# Patient Record
Sex: Female | Born: 1980 | Race: White | Hispanic: No | Marital: Married | Smoking: Former smoker
Health system: Southern US, Community
[De-identification: ages and names within clinical notes are randomized; demographics above are authoritative.]

## PROBLEM LIST (undated history)

## (undated) DIAGNOSIS — F419 Anxiety disorder, unspecified: Secondary | ICD-10-CM

## (undated) HISTORY — PX: TONSILLECTOMY: SUR1361

## (undated) HISTORY — DX: Anxiety disorder, unspecified: F41.9

---

## 2003-11-11 ENCOUNTER — Emergency Department (HOSPITAL_COMMUNITY): Admission: EM | Admit: 2003-11-11 | Discharge: 2003-11-11 | Payer: Self-pay | Admitting: Family Medicine

## 2004-06-29 ENCOUNTER — Emergency Department: Payer: Self-pay | Admitting: General Practice

## 2004-08-30 ENCOUNTER — Ambulatory Visit (HOSPITAL_COMMUNITY): Admission: RE | Admit: 2004-08-30 | Discharge: 2004-08-30 | Payer: Self-pay | Admitting: Obstetrics & Gynecology

## 2004-12-27 ENCOUNTER — Ambulatory Visit (HOSPITAL_COMMUNITY): Admission: RE | Admit: 2004-12-27 | Discharge: 2004-12-27 | Payer: Self-pay | Admitting: Obstetrics & Gynecology

## 2005-03-13 ENCOUNTER — Inpatient Hospital Stay (HOSPITAL_COMMUNITY): Admission: RE | Admit: 2005-03-13 | Discharge: 2005-03-15 | Payer: Self-pay | Admitting: Obstetrics

## 2010-08-13 DIAGNOSIS — F411 Generalized anxiety disorder: Secondary | ICD-10-CM | POA: Insufficient documentation

## 2010-08-13 DIAGNOSIS — Z9189 Other specified personal risk factors, not elsewhere classified: Secondary | ICD-10-CM | POA: Insufficient documentation

## 2015-03-03 ENCOUNTER — Ambulatory Visit (INDEPENDENT_AMBULATORY_CARE_PROVIDER_SITE_OTHER): Payer: Self-pay | Admitting: Family Medicine

## 2015-03-03 VITALS — BP 131/90 | HR 127 | Temp 99.7°F | Resp 16 | Ht 64.0 in | Wt 145.4 lb

## 2015-03-03 DIAGNOSIS — R197 Diarrhea, unspecified: Secondary | ICD-10-CM

## 2015-03-03 DIAGNOSIS — R509 Fever, unspecified: Secondary | ICD-10-CM

## 2015-03-03 LAB — POCT URINALYSIS DIPSTICK
BILIRUBIN UA: NEGATIVE
GLUCOSE UA: NEGATIVE
Ketones, UA: NEGATIVE
Leukocytes, UA: NEGATIVE
Nitrite, UA: NEGATIVE
SPEC GRAV UA: 1.01
Urobilinogen, UA: 0.2
pH, UA: 6.5

## 2015-03-03 LAB — POCT CBC
GRANULOCYTE PERCENT: 91 % — AB (ref 37–80)
HCT, POC: 45.6 % (ref 37.7–47.9)
Hemoglobin: 15.4 g/dL (ref 12.2–16.2)
LYMPH, POC: 1 (ref 0.6–3.4)
MCH, POC: 31.1 pg (ref 27–31.2)
MCHC: 33.8 g/dL (ref 31.8–35.4)
MCV: 92 fL (ref 80–97)
MID (cbc): 0.4 (ref 0–0.9)
MPV: 7.1 fL (ref 0–99.8)
PLATELET COUNT, POC: 224 10*3/uL (ref 142–424)
POC Granulocyte: 14 — AB (ref 2–6.9)
POC LYMPH PERCENT: 6.5 %L — AB (ref 10–50)
POC MID %: 2.5 %M (ref 0–12)
RBC: 4.96 M/uL (ref 4.04–5.48)
RDW, POC: 12.4 %
WBC: 15.4 10*3/uL — AB (ref 4.6–10.2)

## 2015-03-03 LAB — POCT UA - MICROSCOPIC ONLY
CASTS, UR, LPF, POC: NEGATIVE
CRYSTALS, UR, HPF, POC: NEGATIVE
Mucus, UA: NEGATIVE
Yeast, UA: NEGATIVE

## 2015-03-03 MED ORDER — CIPROFLOXACIN HCL 500 MG PO TABS: 500.0000 mg | ORAL_TABLET | Freq: Two times a day (BID) | ORAL | Status: AC

## 2015-03-03 NOTE — Progress Notes (Signed)
[facility] [address], [place] [address] [phone]  Date:  03/03/2015   Name:  Emily Small   DOB:  11-Aug-1981   MRN:  [id number]  PCP:  No primary care provider on file.    History of Present Illness:  Emily Small is a 34 y.o. female patient who presents to [facility] for chief complaint of diarrhea, fever, chills, and fatigue for the last 2 days. Patient states that she was cleaning her pool, when she developed a headache. The diarrhea shortly ensued. It is a nonbloody diarrhea that has progressively worsened yesterday. She reports 8 bowel movements during the day followed by 8 bowel movements by night and was unable to sleep due to the disturbance. She has some abdominal cramping that diffused, however it is mild at this point. She states that she has no change in appetite and has been hungry but restricts herself to toast, bananas to help with this diarrhea. She has never had diarrhea like this in her life. She has had fever of 101-102. Last administration of Tylenol was at 4:30 AM.  She has mild nausea but no vomiting. There is some generalized body aches. She does not have dizziness associated with her symptoms, however her anxiety includes dizziness and has been anxious since the onset of her symptoms.  She has no urinary symptoms of blood, dysuria, or frequency. There are no vision changes. She has no chest pains or palpitations or shortness of breath.  She has 3 children who have not reported any similar symptoms. One is in a daycare.  She denies any abnormal dietary intake at this time. She has been hydrating well with water, however will drink up to 12 beers per day. She has spent weeks outside working on this pool in the heat.  She feels that her diarrhea has improved over today, though she continues to have diarrhea.     To note, patient states that she is incredibly anxious given her symptoms and that she is at the doctor's office.    There are no active  problems to display for this patient.   Past Medical History  Diagnosis Date  . Anxiety     History reviewed. No pertinent past surgical history.  History  Substance Use Topics  . Smoking status: Never Smoker   . Smokeless tobacco: Never Used  . Alcohol Use: 7.2 oz/week    0 Standard drinks or equivalent, 12 Cans of beer per week    Family History  Problem Relation Age of Onset  . Heart disease Mother   . Hyperlipidemia Mother     No Known Allergies  Medication list has been reviewed and updated.  No current outpatient prescriptions on file prior to visit.   No current facility-administered medications on file prior to visit.    ROS  ROS otherwise unremarkable unless listed above.   Physical Examination: BP 140/80 mmHg  Pulse 130  Temp(Src) 99.7 F (37.6 C) (Oral)  Resp 16  Ht 5\' 4"  (1.626 m)  Wt 145 lb 6.4 oz (65.953 kg)  BMI 24.95 kg/m2  SpO2 99%  LMP  (Approximate) Ideal Body Weight: Weight in (lb) to have BMI = 25: 145.3  Physical Exam alert, cooperative, oriented 4. PERRLA with normal conjunctiva. There is no lymphadenopathy detected. There is no thyromegaly or mass. Breath sounds are normal without wheezing or rhonchi. Heart rate was regular with regular rhythm. No murmurs, gallops, or rubs. Bowel sounds are hyperactive. Belly is  soft there is only mild tenderness along the periumbilical quadrant.  No hepatosplenomegaly.  No CVA tenderness.  Pulses normal in extremities.    CBC    Component Value Date/Time   WBC 15.4* 03/03/2015 1342   RBC 4.96 03/03/2015 1342   HGB 15.4 03/03/2015 1342   HCT 45.6 03/03/2015 1342   MCV 92.0 03/03/2015 1342   MCH 31.1 03/03/2015 1342   MCHC 33.8 03/03/2015 1342     Results for orders placed or performed in visit on 03/03/15  POCT CBC  Result Value Ref Range   WBC 15.4 (A) 4.6 - 10.2 K/uL   Lymph, poc 1.0 0.6 - 3.4   POC LYMPH PERCENT 6.5 (A) 10 - 50 %L   MID (cbc) 0.4 0 - 0.9   POC MID % 2.5 0 - 12 %M    POC Granulocyte 14.0 (A) 2 - 6.9   Granulocyte percent 91.0 (A) 37 - 80 %G   RBC 4.96 4.04 - 5.48 M/uL   Hemoglobin 15.4 12.2 - 16.2 g/dL   HCT, POC 16.1 09.6 - 47.9 %   MCV 92.0 80 - 97 fL   MCH, POC 31.1 27 - 31.2 pg   MCHC 33.8 31.8 - 35.4 g/dL   RDW, POC 04.5 %   Platelet Count, POC 224 142 - 424 K/uL   MPV 7.1 0 - 99.8 fL  POCT UA - Microscopic Only  Result Value Ref Range   WBC, Ur, HPF, POC 0-2    RBC, urine, microscopic 0-2    Bacteria, U Microscopic trace    Mucus, UA negative    Epithelial cells, urine per micros 0-3    Crystals, Ur, HPF, POC negative    Casts, Ur, LPF, POC negtaive    Yeast, UA negative   POCT urinalysis dipstick  Result Value Ref Range   Color, UA yellow    Clarity, UA clear    Glucose, UA negative    Bilirubin, UA negative    Ketones, UA negative    Spec Grav, UA 1.010    Blood, UA small    pH, UA 6.5    Protein, UA trace    Urobilinogen, UA 0.2    Nitrite, UA negative    Leukocytes, UA Negative Negative   Orthostatic VS for the past 24 hrs:  BP- Lying Pulse- Lying BP- Sitting Pulse- Sitting BP- Standing at 0 minutes Pulse- Standing at 0 minutes  03/03/15 1356 131/90 mmHg 90 129/85 mmHg 61 (!) 137/92 mmHg 139  (BP cuff stopped working for a second, causing the patient to become significantly nervous)   Assessment and Plan:  34 year old female with past medical history of anxiety is here today for chief complaint of diarrhea, headache, fatigue. and body aches.   likely gastroenteritis. Set. We will treat her for bacterial as there is a left shift here. This could also be a combination of hyperthermia as well as gastroenteritis.  Advised not to use anti-motility at this time.  We will recheck her in 4 days.  Advised of alarming sxs to warrant a more immediate return.  If sxs do not resolve, will add cmet and lipase, but acuity is likely due to gi infection.   Orthostats with error secondary to anxiety.     Diarrhea - Plan: POCT CBC, POCT UA -  Microscopic Only, POCT urinalysis dipstick, ciprofloxacin (CIPRO) 500 MG tablet  Fever, unspecified fever cause - Plan: POCT CBC, POCT UA - Microscopic Only, POCT urinalysis dipstick, ciprofloxacin (CIPRO) 500 MG  tablet    Trena Platt, PA-C [facility] 03/03/2015 12:46 PM

## 2015-03-03 NOTE — Patient Instructions (Signed)
We are going to put you on cipro at this time, in chance of a bacterial etiology for these symptoms.   I would like you to return to [place] on Monday.  I will be here at 12 noon.  There we can recheck you.  Food Choices to Help Relieve Diarrhea When you have diarrhea, the foods you eat and your eating habits are very important. Choosing the right foods and drinks can help relieve diarrhea. Also, because diarrhea can last up to 7 days, you need to replace lost fluids and electrolytes (such as sodium, potassium, and chloride) in order to help prevent dehydration.  WHAT GENERAL GUIDELINES DO I NEED TO FOLLOW?  Slowly drink 1 cup (8 oz) of fluid for each episode of diarrhea. If you are getting enough fluid, your urine will be clear or pale yellow.  Eat starchy foods. Some good choices include white rice, white toast, pasta, low-fiber cereal, baked potatoes (without the skin), saltine crackers, and bagels.  Avoid large servings of any cooked vegetables.  Limit fruit to two servings per day. A serving is  cup or 1 small piece.  Choose foods with less than 2 g of fiber per serving.  Limit fats to less than 8 tsp (38 g) per day.  Avoid fried foods.  Eat foods that have probiotics in them. Probiotics can be found in certain dairy products.  Avoid foods and beverages that may increase the speed at which food moves through the stomach and intestines (gastrointestinal tract). Things to avoid include:  High-fiber foods, such as dried fruit, raw fruits and vegetables, nuts, seeds, and whole grain foods.  Spicy foods and high-fat foods.  Foods and beverages sweetened with high-fructose corn syrup, honey, or sugar alcohols such as xylitol, sorbitol, and mannitol. WHAT FOODS ARE RECOMMENDED? Grains White rice. White, [place], or pita breads (fresh or toasted), including plain rolls, buns, or bagels. White pasta. Saltine, soda, or graham crackers. Pretzels. Low-fiber cereal. Cooked cereals made with water  (such as cornmeal, farina, or cream cereals). Plain muffins. Matzo. Melba toast. Zwieback.  Vegetables Potatoes (without the skin). Strained tomato and vegetable juices. Most well-cooked and canned vegetables without seeds. Tender lettuce. Fruits Cooked or canned applesauce, apricots, cherries, fruit cocktail, grapefruit, peaches, pears, or plums. Fresh bananas, apples without skin, cherries, grapes, cantaloupe, grapefruit, peaches, oranges, or plums.  Meat and Other Protein Products Baked or boiled chicken. Eggs. Tofu. Fish. Seafood. Smooth peanut butter. Ground or well-cooked tender beef, ham, veal, lamb, pork, or poultry.  Dairy Plain yogurt, kefir, and unsweetened liquid yogurt. Lactose-free milk, buttermilk, or soy milk. Plain hard cheese. Beverages Sport drinks. Clear broths. Diluted fruit juices (except prune). Regular, caffeine-free sodas such as ginger ale. Water. Decaffeinated teas. Oral rehydration solutions. Sugar-free beverages not sweetened with sugar alcohols. Other Bouillon, broth, or soups made from recommended foods.  The items listed above may not be a complete list of recommended foods or beverages. Contact your dietitian for more options. WHAT FOODS ARE NOT RECOMMENDED? Grains Whole grain, whole wheat, bran, or rye breads, rolls, pastas, crackers, and cereals. Wild or brown rice. Cereals that contain more than 2 g of fiber per serving. Corn tortillas or taco shells. Cooked or dry oatmeal. Granola. Popcorn. Vegetables Raw vegetables. Cabbage, broccoli, Brussels sprouts, artichokes, baked beans, beet greens, corn, kale, legumes, peas, sweet potatoes, and yams. Potato skins. Cooked spinach and cabbage. Fruits Dried fruit, including raisins and dates. Raw fruits. Stewed or dried prunes. Fresh apples with skin, apricots, mangoes, pears, raspberries,  and strawberries.  Meat and Other Protein Products Chunky peanut butter. Nuts and seeds. Beans and lentils. Tomasa Blase.   Dairy High-fat cheeses. Milk, chocolate milk, and beverages made with milk, such as milk shakes. Cream. Ice cream. Sweets and Desserts Sweet rolls, doughnuts, and sweet breads. Pancakes and waffles. Fats and Oils Butter. Cream sauces. Margarine. Salad oils. Plain salad dressings. Olives. Avocados.  Beverages Caffeinated beverages (such as coffee, tea, soda, or energy drinks). Alcoholic beverages. Fruit juices with pulp. Prune juice. Soft drinks sweetened with high-fructose corn syrup or sugar alcohols. Other Coconut. Hot sauce. Chili powder. Mayonnaise. Gravy. Cream-based or milk-based soups.  The items listed above may not be a complete list of foods and beverages to avoid. Contact your dietitian for more information. WHAT SHOULD I DO IF I BECOME DEHYDRATED? Diarrhea can sometimes lead to dehydration. Signs of dehydration include dark urine and dry mouth and skin. If you think you are dehydrated, you should rehydrate with an oral rehydration solution. These solutions can be purchased at pharmacies, retail stores, or online.  Drink -1 cup (120-240 mL) of oral rehydration solution each time you have an episode of diarrhea. If drinking this amount makes your diarrhea worse, try drinking smaller amounts more often. For example, drink 1-3 tsp (5-15 mL) every 5-10 minutes.  A general rule for staying hydrated is to drink 1-2 L of fluid per day. Talk to your health care provider about the specific amount you should be drinking each day. Drink enough fluids to keep your urine clear or pale yellow. Document Released: 10/20/2003 Document Revised: 08/04/2013 Document Reviewed: 06/22/2013 [facility] Patient Information 2015 [place], [place]. This information is not intended to replace advice given to you by your health care provider. Make sure you discuss any questions you have with your health care provider.

## 2015-03-07 ENCOUNTER — Telehealth: Payer: Self-pay

## 2015-03-07 NOTE — Telephone Encounter (Signed)
Pt states she needs to talk to her provider about how she is doing,and also questions re meds   Best phone for pt is [phone]

## 2015-03-08 NOTE — Progress Notes (Signed)
Patient ID: Emily Small, female   DOB: 02-17-1981, 34 y.o.   MRN: [id number] Reviewed documentation and agree w/ assessment and plan. Norberto Sorenson, MD MPH

## 2015-03-08 NOTE — Telephone Encounter (Signed)
Pt called and says she is feeling 100% better.  Pt said she read online that she cannot take Celexa and Cipro together. She read it can cause rapid heartbeat. She is afraid it will mess with her anxiety. She wants a different abx that she can take with Celexa and not have any worries.

## 2015-05-12 ENCOUNTER — Other Ambulatory Visit: Payer: Self-pay

## 2015-05-16 LAB — CYTOLOGY - PAP

## 2015-08-14 NOTE — L&D Delivery Note (Signed)
Delivery Note At 1:19 PM a healthy female was delivered via Vaginal, Spontaneous Delivery (Presentation: OA  ).  APGAR:8 ,9; weight  pending.  Moderate meconium stained fluid. Placenta status: delivered spontaneously.  Cord:  with the following complications: none.   Anesthesia: Epidural  Episiotomy:  None Lacerations: first degree repaired for hemostasis  Suture Repair: 3.0 vicryl rapide Est. Blood Loss (mL):  175ml  Mom to postpartum.  Baby to Couplet care / Skin to Skin. D/Small pt circumcision and they plan to proceed at pediatrician's office.  Emily Small,Emily Small 03/21/2016, 1:46 PM

## 2015-08-24 DIAGNOSIS — O09529 Supervision of elderly multigravida, unspecified trimester: Secondary | ICD-10-CM | POA: Insufficient documentation

## 2015-08-24 LAB — OB RESULTS CONSOLE RPR: RPR: NONREACTIVE

## 2015-08-24 LAB — OB RESULTS CONSOLE GC/CHLAMYDIA
Chlamydia: NEGATIVE
Gonorrhea: NEGATIVE

## 2015-08-24 LAB — OB RESULTS CONSOLE ABO/RH: RH TYPE: POSITIVE

## 2015-08-24 LAB — OB RESULTS CONSOLE ANTIBODY SCREEN: Antibody Screen: NEGATIVE

## 2015-08-24 LAB — OB RESULTS CONSOLE RUBELLA ANTIBODY, IGM: Rubella: IMMUNE

## 2015-08-24 LAB — OB RESULTS CONSOLE HIV ANTIBODY (ROUTINE TESTING): HIV: NONREACTIVE

## 2015-08-24 LAB — OB RESULTS CONSOLE HEPATITIS B SURFACE ANTIGEN: HEP B S AG: NEGATIVE

## 2015-09-21 DIAGNOSIS — O09529 Supervision of elderly multigravida, unspecified trimester: Secondary | ICD-10-CM | POA: Insufficient documentation

## 2016-01-16 ENCOUNTER — Encounter: Payer: Medicaid Other | Attending: Obstetrics and Gynecology | Admitting: Skilled Nursing Facility1

## 2016-01-16 VITALS — Ht 62.0 in | Wt 171.0 lb

## 2016-01-16 DIAGNOSIS — O24419 Gestational diabetes mellitus in pregnancy, unspecified control: Secondary | ICD-10-CM | POA: Diagnosis present

## 2016-01-16 DIAGNOSIS — O24410 Gestational diabetes mellitus in pregnancy, diet controlled: Secondary | ICD-10-CM

## 2016-01-17 ENCOUNTER — Encounter: Payer: Self-pay | Admitting: Skilled Nursing Facility1

## 2016-01-17 NOTE — Progress Notes (Signed)
  Patient was seen on 01/16/2016 for Gestational Diabetes self-management class at [facility]. The following learning objectives were met by the patient during this course:   States the definition of Gestational Diabetes  States why dietary management is important in controlling blood glucose  Describes the effects each nutrient has on blood glucose levels  Demonstrates ability to create a balanced meal plan  Demonstrates carbohydrate counting   States when to check blood glucose levels involving a total of 4 separate occurences in a day  Demonstrates proper blood glucose monitoring techniques  States the effect of stress and exercise on blood glucose levels  States the importance of limiting caffeine and abstaining from alcohol and smoking  Demonstrates the knowledge the glucometer provided in class may not be covered by their insurance and to call their insurance provider immediately after class to know which glucometer their insurance provider does cover as well as calling their physician the next day for a prescription to the glucometer their insurance does cover (if the one provided is not) as well as the lancets and strips for that meter.  Blood glucose monitor given: Contour next one Lot # [id number] Exp: 10/10/2016 Blood glucose reading: Not taken due to unavailability of strips  Patient instructed to monitor glucose levels: FBS: 60 - <90 1 hour: <140 2 hour: <120  *Patient received handouts:  Nutrition Diabetes and Pregnancy  Carbohydrate Counting List  Patient will be seen for follow-up as needed.

## 2016-02-01 ENCOUNTER — Encounter: Payer: Self-pay | Admitting: Obstetrics and Gynecology

## 2016-02-22 LAB — OB RESULTS CONSOLE GBS: GBS: NEGATIVE

## 2016-03-20 ENCOUNTER — Encounter (HOSPITAL_COMMUNITY): Payer: Self-pay | Admitting: *Deleted

## 2016-03-20 ENCOUNTER — Inpatient Hospital Stay (HOSPITAL_COMMUNITY)
Admission: AD | Admit: 2016-03-20 | Discharge: 2016-03-20 | Disposition: A | Discharge status: Home / Self Care | Payer: Medicaid Other | Source: Ambulatory Visit | Attending: Obstetrics and Gynecology | Admitting: Obstetrics and Gynecology

## 2016-03-20 NOTE — Progress Notes (Signed)
Notified of pt arrival in MAU and exam after 1 hour of walking. Will discharge home with labor precautions

## 2016-03-20 NOTE — MAU Note (Signed)
PT  SAYS [facility]  STRONG  SINCE 3 PM--   [facility]  AT    [facility]-  LAST   Thursday    VE   1  CM  .       DENIES HSV AND  RMSA.  GBS-  NEG

## 2016-03-20 NOTE — Discharge Instructions (Signed)
Third Trimester of Pregnancy °The third trimester is from week 29 through week 42, months 7 through 9. The third trimester is a time when the fetus is growing rapidly. At the end of the ninth month, the fetus is about 20 inches in length and weighs 6-10 pounds.  °BODY CHANGES °Your body goes through many changes during pregnancy. The changes vary from woman to woman.  °· Your weight will continue to increase. You can expect to gain 25-35 pounds (11-16 kg) by the end of the pregnancy. °· You may begin to get stretch marks on your hips, abdomen, and breasts. °· You may urinate more often because the fetus is moving lower into your pelvis and pressing on your bladder. °· You may develop or continue to have heartburn as a result of your pregnancy. °· You may develop constipation because certain hormones are causing the muscles that push waste through your intestines to slow down. °· You may develop hemorrhoids or swollen, bulging veins (varicose veins). °· You may have pelvic pain because of the weight gain and pregnancy hormones relaxing your joints between the bones in your pelvis. Backaches may result from overexertion of the muscles supporting your posture. °· You may have changes in your hair. These can include thickening of your hair, rapid growth, and changes in texture. Some women also have hair loss during or after pregnancy, or hair that feels dry or thin. Your hair will most likely return to normal after your baby is born. °· Your breasts will continue to grow and be tender. A yellow discharge may leak from your breasts called colostrum. °· Your belly button may stick out. °· You may feel short of breath because of your expanding uterus. °· You may notice the fetus "dropping," or moving lower in your abdomen. °· You may have a bloody mucus discharge. This usually occurs a few days to a week before labor begins. °· Your cervix becomes thin and soft (effaced) near your due date. °WHAT TO EXPECT AT YOUR PRENATAL  EXAMS  °You will have prenatal exams every 2 weeks until week 36. Then, you will have weekly prenatal exams. During a routine prenatal visit: °· You will be weighed to make sure you and the fetus are growing normally. °· Your blood pressure is taken. °· Your abdomen will be measured to track your baby's growth. °· The fetal heartbeat will be listened to. °· Any test results from the previous visit will be discussed. °· You may have a cervical check near your due date to see if you have effaced. °At around 36 weeks, your caregiver will check your cervix. At the same time, your caregiver will also perform a test on the secretions of the vaginal tissue. This test is to determine if a type of bacteria, Group B streptococcus, is present. Your caregiver will explain this further. °Your caregiver may ask you: °· What your birth plan is. °· How you are feeling. °· If you are feeling the baby move. °· If you have had any abnormal symptoms, such as leaking fluid, bleeding, severe headaches, or abdominal cramping. °· If you are using any tobacco products, including cigarettes, chewing tobacco, and electronic cigarettes. °· If you have any questions. °Other tests or screenings that may be performed during your third trimester include: °· Blood tests that check for low iron levels (anemia). °· Fetal testing to check the health, activity level, and growth of the fetus. Testing is done if you have certain medical conditions or if   there are problems during the pregnancy. °· HIV (human immunodeficiency virus) testing. If you are at high risk, you may be screened for HIV during your third trimester of pregnancy. °FALSE LABOR °You may feel small, irregular contractions that eventually go away. These are called Braxton Hicks contractions, or false labor. Contractions may last for hours, days, or even weeks before true labor sets in. If contractions come at regular intervals, intensify, or become painful, it is best to be seen by your  caregiver.  °SIGNS OF LABOR  °· Menstrual-like cramps. °· Contractions that are 5 minutes apart or less. °· Contractions that start on the top of the uterus and spread down to the lower abdomen and back. °· A sense of increased pelvic pressure or back pain. °· A watery or bloody mucus discharge that comes from the vagina. °If you have any of these signs before the 37th week of pregnancy, call your caregiver right away. You need to go to the hospital to get checked immediately. °HOME CARE INSTRUCTIONS  °· Avoid all smoking, herbs, alcohol, and unprescribed drugs. These chemicals affect the formation and growth of the baby. °· Do not use any tobacco products, including cigarettes, chewing tobacco, and electronic cigarettes. If you need help quitting, ask your health care provider. You may receive counseling support and other resources to help you quit. °· Follow your caregiver's instructions regarding medicine use. There are medicines that are either safe or unsafe to take during pregnancy. °· Exercise only as directed by your caregiver. Experiencing uterine cramps is a good sign to stop exercising. °· Continue to eat regular, healthy meals. °· Wear a good support bra for breast tenderness. °· Do not use hot tubs, steam rooms, or saunas. °· Wear your seat belt at all times when driving. °· Avoid raw meat, uncooked cheese, cat litter boxes, and soil used by cats. These carry germs that can cause birth defects in the baby. °· Take your prenatal vitamins. °· Take 1500-2000 mg of calcium daily starting at the 20th week of pregnancy until you deliver your baby. °· Try taking a stool softener (if your caregiver approves) if you develop constipation. Eat more high-fiber foods, such as fresh vegetables or fruit and whole grains. Drink plenty of fluids to keep your urine clear or pale yellow. °· Take warm sitz baths to soothe any pain or discomfort caused by hemorrhoids. Use hemorrhoid cream if your caregiver approves. °· If  you develop varicose veins, wear support hose. Elevate your feet for 15 minutes, 3-4 times a day. Limit salt in your diet. °· Avoid heavy lifting, wear low heal shoes, and practice good posture. °· Rest a lot with your legs elevated if you have leg cramps or low back pain. °· Visit your dentist if you have not gone during your pregnancy. Use a soft toothbrush to brush your teeth and be gentle when you floss. °· A sexual relationship may be continued unless your caregiver directs you otherwise. °· Do not travel far distances unless it is absolutely necessary and only with the approval of your caregiver. °· Take prenatal classes to understand, practice, and ask questions about the labor and delivery. °· Make a trial run to the hospital. °· Pack your hospital bag. °· Prepare the baby's nursery. °· Continue to go to all your prenatal visits as directed by your caregiver. °SEEK MEDICAL CARE IF: °· You are unsure if you are in labor or if your water has broken. °· You have dizziness. °· You have   mild pelvic cramps, pelvic pressure, or nagging pain in your abdominal area. °· You have persistent nausea, vomiting, or diarrhea. °· You have a bad smelling vaginal discharge. °· You have pain with urination. °SEEK IMMEDIATE MEDICAL CARE IF:  °· You have a fever. °· You are leaking fluid from your vagina. °· You have spotting or bleeding from your vagina. °· You have severe abdominal cramping or pain. °· You have rapid weight loss or gain. °· You have shortness of breath with chest pain. °· You notice sudden or extreme swelling of your face, hands, ankles, feet, or legs. °· You have not felt your baby move in over an hour. °· You have severe headaches that do not go away with medicine. °· You have vision changes. °  °This information is not intended to replace advice given to you by your health care provider. Make sure you discuss any questions you have with your health care provider. °  °Document Released: 07/24/2001 Document  Revised: 08/20/2014 Document Reviewed: 09/30/2012 °[employer]vier Interactive Patient Education ©2016 [employer] °Fetal Movement Counts °Patient Name: __________________________________________________ Patient Due Date: ____________________ °Performing a fetal movement count is highly recommended in high-risk pregnancies, but it is good for every pregnant woman to do. Your health care provider may ask you to start counting fetal movements at 28 weeks of the pregnancy. Fetal movements often increase: °· After eating a full meal. °· After physical activity. °· After eating or drinking something sweet or cold. °· At rest. °Pay attention to when you feel the baby is most active. This will help you notice a pattern of your baby's sleep and wake cycles and what factors contribute to an increase in fetal movement. It is important to perform a fetal movement count at the same time each day when your baby is normally most active.  °HOW TO COUNT FETAL MOVEMENTS °1. Find a quiet and comfortable area to sit or lie down on your left side. Lying on your left side provides the best blood and oxygen circulation to your baby. °2. Write down the day and time on a sheet of paper or in a journal. °3. Start counting kicks, flutters, swishes, rolls, or jabs in a 2-hour period. You should feel at least 10 movements within 2 hours. °4. If you do not feel 10 movements in 2 hours, wait 2-3 hours and count again. Look for a change in the pattern or not enough counts in 2 hours. °SEEK MEDICAL CARE IF: °· You feel less than 10 counts in 2 hours, tried twice. °· There is no movement in over an hour. °· The pattern is changing or taking longer each day to reach 10 counts in 2 hours. °· You feel the baby is not moving as he or she usually does. °Date: ____________ Movements: ____________ Start time: ____________ Finish time: ____________  °Date: ____________ Movements: ____________ Start time: ____________ Finish time: ____________ °Date:  ____________ Movements: ____________ Start time: ____________ Finish time: ____________ °Date: ____________ Movements: ____________ Start time: ____________ Finish time: ____________ °Date: ____________ Movements: ____________ Start time: ____________ Finish time: ____________ °Date: ____________ Movements: ____________ Start time: ____________ Finish time: ____________ °Date: ____________ Movements: ____________ Start time: ____________ Finish time: ____________ °Date: ____________ Movements: ____________ Start time: ____________ Finish time: ____________  °Date: ____________ Movements: ____________ Start time: ____________ Finish time: ____________ °Date: ____________ Movements: ____________ Start time: ____________ Finish time: ____________ °Date: ____________ Movements: ____________ Start time: ____________ Finish time: ____________ °Date: ____________ Movements: ____________ Start time: ____________ Finish time: ____________ °Date:   ____________ Movements: ____________ Start time: ____________ Finish time: ____________ °Date: ____________ Movements: ____________ Start time: ____________ Finish time: ____________ °Date: ____________ Movements: ____________ Start time: ____________ Finish time: ____________  °Date: ____________ Movements: ____________ Start time: ____________ Finish time: ____________ °Date: ____________ Movements: ____________ Start time: ____________ Finish time: ____________ °Date: ____________ Movements: ____________ Start time: ____________ Finish time: ____________ °Date: ____________ Movements: ____________ Start time: ____________ Finish time: ____________ °Date: ____________ Movements: ____________ Start time: ____________ Finish time: ____________ °Date: ____________ Movements: ____________ Start time: ____________ Finish time: ____________ °Date: ____________ Movements: ____________ Start time: ____________ Finish time: ____________  °Date: ____________ Movements: ____________ Start  time: ____________ Finish time: ____________ °Date: ____________ Movements: ____________ Start time: ____________ Finish time: ____________ °Date: ____________ Movements: ____________ Start time: ____________ Finish time: ____________ °Date: ____________ Movements: ____________ Start time: ____________ Finish time: ____________ °Date: ____________ Movements: ____________ Start time: ____________ Finish time: ____________ °Date: ____________ Movements: ____________ Start time: ____________ Finish time: ____________ °Date: ____________ Movements: ____________ Start time: ____________ Finish time: ____________  °Date: ____________ Movements: ____________ Start time: ____________ Finish time: ____________ °Date: ____________ Movements: ____________ Start time: ____________ Finish time: ____________ °Date: ____________ Movements: ____________ Start time: ____________ Finish time: ____________ °Date: ____________ Movements: ____________ Start time: ____________ Finish time: ____________ °Date: ____________ Movements: ____________ Start time: ____________ Finish time: ____________ °Date: ____________ Movements: ____________ Start time: ____________ Finish time: ____________ °Date: ____________ Movements: ____________ Start time: ____________ Finish time: ____________  °Date: ____________ Movements: ____________ Start time: ____________ Finish time: ____________ °Date: ____________ Movements: ____________ Start time: ____________ Finish time: ____________ °Date: ____________ Movements: ____________ Start time: ____________ Finish time: ____________ °Date: ____________ Movements: ____________ Start time: ____________ Finish time: ____________ °Date: ____________ Movements: ____________ Start time: ____________ Finish time: ____________ °Date: ____________ Movements: ____________ Start time: ____________ Finish time: ____________ °Date: ____________ Movements: ____________ Start time: ____________ Finish time: ____________    °Date: ____________ Movements: ____________ Start time: ____________ Finish time: ____________ °Date: ____________ Movements: ____________ Start time: ____________ Finish time: ____________ °Date: ____________ Movements: ____________ Start time: ____________ Finish time: ____________ °Date: ____________ Movements: ____________ Start time: ____________ Finish time: ____________ °Date: ____________ Movements: ____________ Start time: ____________ Finish time: ____________ °Date: ____________ Movements: ____________ Start time: ____________ Finish time: ____________ °Date: ____________ Movements: ____________ Start time: ____________ Finish time: ____________  °Date: ____________ Movements: ____________ Start time: ____________ Finish time: ____________ °Date: ____________ Movements: ____________ Start time: ____________ Finish time: ____________ °Date: ____________ Movements: ____________ Start time: ____________ Finish time: ____________ °Date: ____________ Movements: ____________ Start time: ____________ Finish time: ____________ °Date: ____________ Movements: ____________ Start time: ____________ Finish time: ____________ °Date: ____________ Movements: ____________ Start time: ____________ Finish time: ____________ °  °This information is not intended to replace advice given to you by your health care provider. Make sure you discuss any questions you have with your health care provider. °  °Document Released: 08/29/2006 Document Revised: 08/20/2014 Document Reviewed: 05/26/2012 °[employer] Interactive Patient Education ©2016 [employer] °Braxton Hicks Contractions °Contractions of the uterus can occur throughout pregnancy. Contractions are not always a sign that you are in labor.  °WHAT ARE BRAXTON HICKS CONTRACTIONS?  °Contractions that occur before labor are called Braxton Hicks contractions, or false labor. Toward the end of pregnancy (32-34 weeks), these contractions can develop more often and may become more  forceful. This is not true labor because these contractions do not result in opening (dilatation) and thinning of the cervix. They are sometimes difficult to tell apart from true labor because these contractions can be forceful and people have different pain tolerances. You   should not feel embarrassed if you go to the hospital with false labor. Sometimes, the only way to tell if you are in true labor is for your health care provider to look for changes in the cervix. °If there are no prenatal problems or other health problems associated with the pregnancy, it is completely safe to be sent home with false labor and await the onset of true labor. °HOW CAN YOU TELL THE DIFFERENCE BETWEEN TRUE AND FALSE LABOR? °False Labor °· The contractions of false labor are usually shorter and not as hard as those of true labor.   °· The contractions are usually irregular.   °· The contractions are often felt in the front of the lower abdomen and in the groin.   °· The contractions may go away when you walk around or change positions while lying down.   °· The contractions get weaker and are shorter lasting as time goes on.   °· The contractions do not usually become progressively stronger, regular, and closer together as with true labor.   °True Labor °· Contractions in true labor last 30-70 seconds, become very regular, usually become more intense, and increase in frequency.   °· The contractions do not go away with walking.   °· The discomfort is usually felt in the top of the uterus and spreads to the lower abdomen and low back.   °· True labor can be determined by your health care provider with an exam. This will show that the cervix is dilating and getting thinner.   °WHAT TO REMEMBER °· Keep up with your usual exercises and follow other instructions given by your health care provider.   °· Take medicines as directed by your health care provider.   °· Keep your regular prenatal appointments.   °· Eat and drink lightly if you  think you are going into labor.   °· If Braxton Hicks contractions are making you uncomfortable:   °¨ Change your position from lying down or resting to walking, or from walking to resting.   °¨ Sit and rest in a tub of warm water.   °¨ Drink 2-3 glasses of water. Dehydration may cause these contractions.   °¨ Do slow and deep breathing several times an hour.   °WHEN SHOULD I SEEK IMMEDIATE MEDICAL CARE? °Seek immediate medical care if: °· Your contractions become stronger, more regular, and closer together.   °· You have fluid leaking or gushing from your vagina.   °· You have a fever.   °· You pass blood-tinged mucus.   °· You have vaginal bleeding.   °· You have continuous abdominal pain.   °· You have low back pain that you never had before.   °· You feel your baby's head pushing down and causing pelvic pressure.   °· Your baby is not moving as much as it used to.   °  °This information is not intended to replace advice given to you by your health care provider. Make sure you discuss any questions you have with your health care provider. °  °Document Released: 07/30/2005 Document Revised: 08/04/2013 Document Reviewed: 05/11/2013 °[employer]vier Interactive Patient Education ©2016 [employer] ° °

## 2016-03-21 ENCOUNTER — Inpatient Hospital Stay (HOSPITAL_COMMUNITY): Payer: Medicaid Other | Admitting: Anesthesiology

## 2016-03-21 ENCOUNTER — Inpatient Hospital Stay (HOSPITAL_COMMUNITY)
Admission: AD | Admit: 2016-03-21 | Discharge: 2016-03-23 | DRG: 775 | Disposition: A | Discharge status: Home / Self Care | Payer: Medicaid Other | Source: Ambulatory Visit | Attending: Obstetrics and Gynecology | Admitting: Obstetrics and Gynecology

## 2016-03-21 ENCOUNTER — Encounter (HOSPITAL_COMMUNITY): Payer: Self-pay | Admitting: *Deleted

## 2016-03-21 DIAGNOSIS — O24420 Gestational diabetes mellitus in childbirth, diet controlled: Secondary | ICD-10-CM | POA: Diagnosis present

## 2016-03-21 DIAGNOSIS — O99344 Other mental disorders complicating childbirth: Secondary | ICD-10-CM | POA: Diagnosis present

## 2016-03-21 DIAGNOSIS — Z3A39 39 weeks gestation of pregnancy: Secondary | ICD-10-CM

## 2016-03-21 DIAGNOSIS — F419 Anxiety disorder, unspecified: Secondary | ICD-10-CM | POA: Diagnosis present

## 2016-03-21 DIAGNOSIS — K219 Gastro-esophageal reflux disease without esophagitis: Secondary | ICD-10-CM | POA: Diagnosis present

## 2016-03-21 DIAGNOSIS — Z3483 Encounter for supervision of other normal pregnancy, third trimester: Secondary | ICD-10-CM | POA: Diagnosis present

## 2016-03-21 DIAGNOSIS — O9962 Diseases of the digestive system complicating childbirth: Secondary | ICD-10-CM | POA: Diagnosis present

## 2016-03-21 LAB — CBC
HCT: 41.3 % (ref 36.0–46.0)
Hemoglobin: 14.1 g/dL (ref 12.0–15.0)
MCH: 29.4 pg (ref 26.0–34.0)
MCHC: 34.1 g/dL (ref 30.0–36.0)
MCV: 86.2 fL (ref 78.0–100.0)
PLATELETS: 212 10*3/uL (ref 150–400)
RBC: 4.79 MIL/uL (ref 3.87–5.11)
RDW: 18.3 % — AB (ref 11.5–15.5)
WBC: 12.6 10*3/uL — ABNORMAL HIGH (ref 4.0–10.5)

## 2016-03-21 LAB — ABO/RH: ABO/RH(D): A POS

## 2016-03-21 LAB — TYPE AND SCREEN
ABO/RH(D): A POS
Antibody Screen: NEGATIVE

## 2016-03-21 LAB — GLUCOSE, CAPILLARY: GLUCOSE-CAPILLARY: 96 mg/dL (ref 65–99)

## 2016-03-21 MED ORDER — OXYTOCIN BOLUS FROM INFUSION
500.0000 mL | Freq: Once | INTRAVENOUS | Status: AC
  Administered 2016-03-21: 500 mL via INTRAVENOUS

## 2016-03-21 MED ORDER — WITCH HAZEL-GLYCERIN EX PADS: 1.0000 "application " | MEDICATED_PAD | CUTANEOUS | Status: DC | PRN

## 2016-03-21 MED ORDER — IBUPROFEN 600 MG PO TABS
600.0000 mg | ORAL_TABLET | Freq: Four times a day (QID) | ORAL | Status: DC
  Administered 2016-03-21 – 2016-03-23 (×7): 600 mg via ORAL
  Filled 2016-03-21 (×7): qty 1

## 2016-03-21 MED ORDER — PRENATAL MULTIVITAMIN CH
1.0000 | ORAL_TABLET | Freq: Every day | ORAL | Status: DC
  Administered 2016-03-22: 1 via ORAL
  Filled 2016-03-21: qty 1

## 2016-03-21 MED ORDER — COCONUT OIL OIL: 1.0000 "application " | TOPICAL_OIL | Status: DC | PRN

## 2016-03-21 MED ORDER — LIDOCAINE HCL (PF) 1 % IJ SOLN
30.0000 mL | INTRAMUSCULAR | Status: DC | PRN
  Filled 2016-03-21: qty 30

## 2016-03-21 MED ORDER — ZOLPIDEM TARTRATE 5 MG PO TABS: 5.0000 mg | ORAL_TABLET | Freq: Every evening | ORAL | Status: DC | PRN

## 2016-03-21 MED ORDER — PHENYLEPHRINE 40 MCG/ML (10ML) SYRINGE FOR IV PUSH (FOR BLOOD PRESSURE SUPPORT)
80.0000 ug | PREFILLED_SYRINGE | INTRAVENOUS | Status: DC | PRN
  Filled 2016-03-21: qty 5

## 2016-03-21 MED ORDER — OXYTOCIN 40 UNITS IN LACTATED RINGERS INFUSION - SIMPLE MED
1.0000 m[IU]/min | INTRAVENOUS | Status: DC
  Administered 2016-03-21: 2 m[IU]/min via INTRAVENOUS
  Filled 2016-03-21: qty 1000

## 2016-03-21 MED ORDER — LIDOCAINE HCL (PF) 1 % IJ SOLN
INTRAMUSCULAR | Status: DC | PRN
  Administered 2016-03-21: 6 mL via EPIDURAL
  Administered 2016-03-21: 7 mL via EPIDURAL

## 2016-03-21 MED ORDER — FENTANYL 2.5 MCG/ML BUPIVACAINE 1/10 % EPIDURAL INFUSION (WH - ANES)
14.0000 mL/h | INTRAMUSCULAR | Status: DC | PRN
  Administered 2016-03-21: 14 mL/h via EPIDURAL
  Filled 2016-03-21: qty 125

## 2016-03-21 MED ORDER — SIMETHICONE 80 MG PO CHEW: 80.0000 mg | CHEWABLE_TABLET | ORAL | Status: DC | PRN

## 2016-03-21 MED ORDER — OXYTOCIN 40 UNITS IN LACTATED RINGERS INFUSION - SIMPLE MED: 2.5000 [IU]/h | INTRAVENOUS | Status: DC

## 2016-03-21 MED ORDER — DIPHENHYDRAMINE HCL 25 MG PO CAPS: 25.0000 mg | ORAL_CAPSULE | Freq: Four times a day (QID) | ORAL | Status: DC | PRN

## 2016-03-21 MED ORDER — DIPHENHYDRAMINE HCL 50 MG/ML IJ SOLN: 12.5000 mg | INTRAMUSCULAR | Status: DC | PRN

## 2016-03-21 MED ORDER — OXYCODONE-ACETAMINOPHEN 5-325 MG PO TABS: 1.0000 | ORAL_TABLET | ORAL | Status: DC | PRN

## 2016-03-21 MED ORDER — EPHEDRINE 5 MG/ML INJ
10.0000 mg | INTRAVENOUS | Status: DC | PRN
  Filled 2016-03-21: qty 4

## 2016-03-21 MED ORDER — FLEET ENEMA 7-19 GM/118ML RE ENEM: 1.0000 | ENEMA | RECTAL | Status: DC | PRN

## 2016-03-21 MED ORDER — OXYCODONE HCL 5 MG PO TABS: 5.0000 mg | ORAL_TABLET | ORAL | Status: DC | PRN

## 2016-03-21 MED ORDER — LACTATED RINGERS IV SOLN
INTRAVENOUS | Status: DC
  Administered 2016-03-21: 125 mL/h via INTRAVENOUS

## 2016-03-21 MED ORDER — LACTATED RINGERS IV SOLN: 500.0000 mL | INTRAVENOUS | Status: DC | PRN

## 2016-03-21 MED ORDER — DIBUCAINE 1 % RE OINT: 1.0000 "application " | TOPICAL_OINTMENT | RECTAL | Status: DC | PRN

## 2016-03-21 MED ORDER — OXYCODONE HCL 5 MG PO TABS: 10.0000 mg | ORAL_TABLET | ORAL | Status: DC | PRN

## 2016-03-21 MED ORDER — SENNOSIDES-DOCUSATE SODIUM 8.6-50 MG PO TABS
2.0000 | ORAL_TABLET | ORAL | Status: DC
  Administered 2016-03-21 – 2016-03-23 (×2): 2 via ORAL
  Filled 2016-03-21 (×2): qty 2

## 2016-03-21 MED ORDER — OXYCODONE-ACETAMINOPHEN 5-325 MG PO TABS: 2.0000 | ORAL_TABLET | ORAL | Status: DC | PRN

## 2016-03-21 MED ORDER — ONDANSETRON HCL 4 MG/2ML IJ SOLN: 4.0000 mg | Freq: Four times a day (QID) | INTRAMUSCULAR | Status: DC | PRN

## 2016-03-21 MED ORDER — TERBUTALINE SULFATE 1 MG/ML IJ SOLN
0.2500 mg | Freq: Once | INTRAMUSCULAR | Status: DC | PRN
  Filled 2016-03-21: qty 1

## 2016-03-21 MED ORDER — ACETAMINOPHEN 325 MG PO TABS: 650.0000 mg | ORAL_TABLET | ORAL | Status: DC | PRN

## 2016-03-21 MED ORDER — TETANUS-DIPHTH-ACELL PERTUSSIS 5-2.5-18.5 LF-MCG/0.5 IM SUSP: 0.5000 mL | Freq: Once | INTRAMUSCULAR | Status: DC

## 2016-03-21 MED ORDER — SOD CITRATE-CITRIC ACID 500-334 MG/5ML PO SOLN: 30.0000 mL | ORAL | Status: DC | PRN

## 2016-03-21 MED ORDER — PHENYLEPHRINE 40 MCG/ML (10ML) SYRINGE FOR IV PUSH (FOR BLOOD PRESSURE SUPPORT)
80.0000 ug | PREFILLED_SYRINGE | INTRAVENOUS | Status: DC | PRN
  Filled 2016-03-21: qty 5
  Filled 2016-03-21: qty 10

## 2016-03-21 MED ORDER — LACTATED RINGERS IV SOLN: 500.0000 mL | Freq: Once | INTRAVENOUS | Status: DC

## 2016-03-21 MED ORDER — BENZOCAINE-MENTHOL 20-0.5 % EX AERO
1.0000 "application " | INHALATION_SPRAY | CUTANEOUS | Status: DC | PRN
  Administered 2016-03-21: 1 via TOPICAL
  Filled 2016-03-21: qty 56

## 2016-03-21 MED ORDER — ONDANSETRON HCL 4 MG/2ML IJ SOLN: 4.0000 mg | INTRAMUSCULAR | Status: DC | PRN

## 2016-03-21 MED ORDER — ONDANSETRON HCL 4 MG PO TABS
4.0000 mg | ORAL_TABLET | ORAL | Status: DC | PRN
Start: 2016-03-21 — End: 2016-03-23

## 2016-03-21 NOTE — Lactation Note (Signed)
This note was copied from a baby's chart. Lactation Consultation Note Initial visit at 3 hours of age.  Mom reports a feeding after delivery and then another 20 minute feedings.  Mom denies pain or concerns.  Mom is expeirenced and states this baby is doing great. Baby is asleep with visitor.  [facility] and [facility].  Encouraged to feed with early cues on demand.  Early newborn behavior discussed.  Hand expression demonstrated with colostrum visible. LC instructed mom on proper technique and encouraged mom to stay behind areola and no pinching on nipples.   Mom to call for assist as needed.     Patient Name: Boy Jacky KindleKeysha Anspach ZOXWR'UToday's Date: 03/21/2016 Reason for consult: Initial assessment   Maternal Data Has patient been taught Hand Expression?: Yes Does the patient have breastfeeding experience prior to this delivery?: Yes  Feeding Feeding Type: Breast Fed Length of feed: 20 min  LATCH Score/Interventions Latch: Grasps breast easily, tongue down, lips flanged, rhythmical sucking.  Audible Swallowing: A few with stimulation Intervention(s): Skin to skin  Type of Nipple: Everted at rest and after stimulation  Comfort (Breast/Nipple): Soft / non-tender     Hold (Positioning): No assistance needed to correctly position infant at breast. Intervention(s): Breastfeeding basics reviewed;Support Pillows;Position options;Skin to skin (education)  LATCH Score: 9  Lactation Tools Discussed/Used WIC Program: No   Consult Status Consult Status: Follow-up Date: 03/22/16 Follow-up type: In-patient    Beverely RisenShoptaw, Arvella MerlesJana Lynn 03/21/2016, 4:51 PM

## 2016-03-21 NOTE — MAU Note (Signed)
PT  HAS RETURNED   SAYS [facility]  STRONGER.

## 2016-03-21 NOTE — Anesthesia Preprocedure Evaluation (Signed)
Anesthesia Evaluation  Patient identified by MRN, date of birth, ID band Patient awake    Reviewed: Allergy & Precautions, H&P , NPO status , Patient's Chart, lab work & pertinent test results  Airway Mallampati: II  TM Distance: >3 FB Neck ROM: full    Dental no notable dental hx.    Pulmonary neg pulmonary ROS,    Pulmonary exam normal       Cardiovascular negative cardio ROS Normal cardiovascular exam    Neuro/Psych negative neurological ROS     GI/Hepatic negative GI ROS, Neg liver ROS,   Endo/Other  negative endocrine ROS  Renal/GU negative Renal ROS     Musculoskeletal   Abdominal (+) + obese,   Peds  Hematology negative hematology ROS (+)   Anesthesia Other Findings   Reproductive/Obstetrics (+) Pregnancy                             Anesthesia Physical Anesthesia Plan  ASA: II  Anesthesia Plan: Epidural   Post-op Pain Management:    Induction:   Airway Management Planned:   Additional Equipment:   Intra-op Plan:   Post-operative Plan:   Informed Consent: I have reviewed the patients History and Physical, chart, labs and discussed the procedure including the risks, benefits and alternatives for the proposed anesthesia with the patient or authorized representative who has indicated his/her understanding and acceptance.     Plan Discussed with:   Anesthesia Plan Comments:         Anesthesia Quick Evaluation  

## 2016-03-21 NOTE — H&P (Signed)
Emily Small is a 35 y.o. female G4P3003 at 6339 3/7 weeks by LMP c/w 9 week US) presenting for contractions and cervical change.  Pt seen in MAU yesterday with cervix 1cm and was d/ home.  Returned today and was 3-4cm and more uncomfortable so admitted.   Prenatal care complicated by AMA with normal Panorama.  She also had diet controlled GDM with reasonable control of BS.  The fetal US had an isolated EIF, but no other abnormal findings.  She has a h/o anxiety and used celexa in past but has done ok off meds this pregnancy.  OB History    Gravida Para Term Preterm AB Living   4 3 3  0 0 3   SAB TAB Ectopic Multiple Live Births   0 0 0 0 3    2002 NSVD 6#3oz 2006 NSVD 6#11oz 2012 NSVD 7#1oz  Past Medical History:  Diagnosis Date  . Anxiety    No past surgical history on file. Family History: family history includes Heart disease in her mother; Hyperlipidemia in her mother. Social History:  reports that she has never smoked. She has never used smokeless tobacco. She reports that she drinks about 7.2 oz of alcohol per week . She reports that she does not use drugs.     Maternal Diabetes: Yes:  Diabetes Type:  Diet controlled Genetic Screening: Normal Maternal Ultrasounds/Referrals: Abnormal:  Findings:   Isolated EIF (echogenic intracardiac focus) Fetal Ultrasounds or other Referrals:  None Maternal Substance Abuse:  No Significant Maternal Medications:  None Significant Maternal Lab Results:  None Other Comments:  None  Review of Systems  Gastrointestinal: Positive for abdominal pain.   Maternal Medical History:  Reason for admission: Contractions.   Contractions: Onset was 6-12 hours ago.   Frequency: regular.   Perceived severity is moderate.    Fetal activity: Perceived fetal activity is normal.    Prenatal Complications - Diabetes: gestational. Diabetes is managed by diet.      Dilation: 3.5 Effacement (%): 80, 70 Station: -1 Exam by:: Dorathy DaftShay Payne RN Blood  pressure 132/78, pulse (!) 101, temperature 98.1 F (36.7 C), temperature source Oral, resp. rate 20, height 5\' 2"  (1.575 m), weight 79.8 kg (176 lb). Maternal Exam:  Uterine Assessment: Contraction strength is moderate.  Contraction frequency is regular.   Abdomen: Patient reports no abdominal tenderness. Fetal presentation: vertex  Introitus: Normal vulva. Normal vagina.    Physical Exam  Constitutional: She is oriented to person, place, and time. She appears well-developed and well-nourished.  Cardiovascular: Normal rate.   Respiratory: Effort normal.  Genitourinary: Vagina normal.  Neurological: She is alert and oriented to person, place, and time.  Psychiatric: She has a normal mood and affect.    Prenatal labs: ABO, Rh: A/Positive/-- (01/11 0000) Antibody: Negative (01/11 0000) Rubella: Immune (01/11 0000) RPR: Nonreactive (01/11 0000)  HBsAg: Negative (01/11 0000)  HIV: Non-reactive (01/11 0000)  GBS: Negative (07/12 0000)  Panorama low risk One hour GCT 162 Three hour GTT 165/234/90/95  Assessment/Plan: Pt getting increasingly uncomfortable and about to receive epidural.  Will check when comfortable and plan AROM.     Oliver PilaRICHARDSON,Hilliary Jock W 03/21/2016, 8:13 AM

## 2016-03-21 NOTE — Anesthesia Pain Management Evaluation Note (Signed)
  CRNA Pain Management Visit Note  Patient: Emily Small, 35 y.o., female  "Hello I am a member of the anesthesia team at [facility]. We have an anesthesia team available at all times to provide care throughout the hospital, including epidural management and anesthesia for C-section. I don't know your plan for the delivery whether it a natural birth, water birth, IV sedation, nitrous supplementation, doula or epidural, but we want to meet your pain goals."   1.Was your pain managed to your expectations on prior hospitalizations?     2.What is your expectation for pain management during this hospitalization?     3.How can we help you reach that goal?   Record the patient's initial score and the patient's pain goal.   Pain: 2  Pain Goal: 7 [facility] wants you to be able to say your pain was always managed very well.  Emily Small,Emily Small 03/21/2016

## 2016-03-21 NOTE — Anesthesia Postprocedure Evaluation (Signed)
Anesthesia Post Note  Patient: Emily Small  Procedure(s) Performed: * No procedures listed *  Patient location during evaluation: Mother Baby Anesthesia Type: Epidural Level of consciousness: awake and alert Pain management: satisfactory to patient Vital Signs Assessment: post-procedure vital signs reviewed and stable Respiratory status: respiratory function stable Cardiovascular status: stable Postop Assessment: no headache, no backache, epidural receding, patient able to bend at knees, no signs of nausea or vomiting and adequate PO intake Anesthetic complications: no     Last Vitals:  Vitals:   03/21/16 1459 03/21/16 1536  BP: 118/72 128/75  Pulse: (!) 101 97  Resp: 20 16  Temp:  37 C    Last Pain:  Vitals:   03/21/16 1539  TempSrc:   PainSc: 0-No pain   Pain Goal: Patients Stated Pain Goal: 7 (03/21/16 0901)               Karleen DolphinFUSSELL,Demesha Boorman

## 2016-03-21 NOTE — Progress Notes (Signed)
Patient ID: [name], female   DOB: 05/05/1981, 35 y.o.   MRN: [id number] Pt comfortable with epidural afeb vss FHR Category 1 Cervix 90/5/-1 AROM moderate meconium  Contractions slowed a bit so IUPC placed and will augment with pitocin if do not pick up again after AROM.

## 2016-03-21 NOTE — Anesthesia Procedure Notes (Signed)
Epidural Patient location during procedure: OB Start time: 03/21/2016 8:02 AM End time: 03/21/2016 8:06 AM  Staffing Anesthesiologist: Leilani AbleHATCHETT, Dewayne Jurek Performed: anesthesiologist   Preanesthetic Checklist Completed: patient identified, surgical consent, pre-op evaluation, timeout performed, IV checked, risks and benefits discussed and monitors and equipment checked  Epidural Patient position: sitting Prep: site prepped and draped and DuraPrep Patient monitoring: continuous pulse ox and blood pressure Approach: midline Location: L3-L4 Injection technique: LOR air  Needle:  Needle type: Tuohy  Needle gauge: 17 G Needle length: 9 cm and 9 Needle insertion depth: 6 cm Catheter type: closed end flexible Catheter size: 19 Gauge Catheter at skin depth: 11 cm Test dose: negative and Other  Assessment Sensory level: T9 Events: blood not aspirated, injection not painful, no injection resistance, negative IV test and no paresthesia  Additional Notes Reason for block:procedure for pain

## 2016-03-22 LAB — CBC
HEMATOCRIT: 35.3 % — AB (ref 36.0–46.0)
HEMOGLOBIN: 11.7 g/dL — AB (ref 12.0–15.0)
MCH: 29.1 pg (ref 26.0–34.0)
MCHC: 33.1 g/dL (ref 30.0–36.0)
MCV: 87.8 fL (ref 78.0–100.0)
Platelets: 156 10*3/uL (ref 150–400)
RBC: 4.02 MIL/uL (ref 3.87–5.11)
RDW: 18.4 % — ABNORMAL HIGH (ref 11.5–15.5)
WBC: 10.1 10*3/uL (ref 4.0–10.5)

## 2016-03-22 LAB — RPR: RPR: NONREACTIVE

## 2016-03-22 NOTE — Progress Notes (Signed)
MOB was referred for history of depression/anxiety. * Referral screened out by Clinical Social Worker because none of the following criteria appear to apply: ~ History of anxiety/depression during this pregnancy, or of post-partum depression. ~ Diagnosis of anxiety and/or depression within last 3 years OR * MOB's symptoms currently being treated with medication and/or therapy. Please contact the Clinical Social Worker if needs arise, or if MOB requests.  CSW reviewed chart and MOB was dx in 2011, and was treated with medication.  No indication of anxiety throughout this pregnancy.   Bishop LimboAAngel Small, MSW, LCSW Clinical Social Work [phone]

## 2016-03-22 NOTE — Progress Notes (Signed)
Post Partum Day 1 Subjective: no complaints, up ad lib, voiding, tolerating PO and considering discharge to home today if baby able to be discharged  Objective: Blood pressure (!) 146/66, pulse 79, temperature 98.2 F (36.8 C), temperature source Oral, resp. rate 18, height 5\' 2"  (1.575 m), weight 176 lb (79.8 kg), SpO2 100 %, unknown if currently breastfeeding.  Physical Exam:  General: alert, cooperative and no distress Lochia: appropriate Uterine Fundus: firm Incision: n/a DVT Evaluation: No evidence of DVT seen on physical exam. No significant calf/ankle edema.   Recent Labs  03/21/16 0535 03/22/16 0644  HGB 14.1 11.7*  HCT 41.3 35.3*    Assessment/Plan: Will return this afternoon and if baby able to be discharged will discharge pt to home   LOS: 1 day   [facility]lip[facility]ecilia Worema Anya Murphey 03/22/2016, 10:17 AM

## 2016-03-22 NOTE — Plan of Care (Signed)
Problem: Urinary Elimination: Goal: Ability to reestablish a normal urinary elimination pattern will improve Outcome: Completed/Met Date Met: 03/22/16 Voiding without any difficulty.

## 2016-03-22 NOTE — Lactation Note (Signed)
This note was copied from a baby's chart. Lactation Consultation Note  Patient Name: Emily Small ZOXWR'UToday's Date: 03/22/2016 Reason for consult: Follow-up assessment Baby 25 hours old. Mom reports that baby has been nursing well and often. Mom states that she thinks baby is very satisfied at this point. Baby is asleep in mom's lap. Enc mom to continue to nurse with cues and call for assistance as needed.   Maternal Data    Feeding Feeding Type: Breast Fed  LATCH Score/Interventions Latch: Grasps breast easily, tongue down, lips flanged, rhythmical sucking.  Audible Swallowing: Spontaneous and intermittent  Type of Nipple: Everted at rest and after stimulation  Comfort (Breast/Nipple): Soft / non-tender     Hold (Positioning): No assistance needed to correctly position infant at breast.  LATCH Score: 10  Lactation Tools Discussed/Used     Consult Status Consult Status: PRN    Sherlyn HayJennifer D Helyne Genther 03/22/2016, 2:47 PM

## 2016-03-22 NOTE — Progress Notes (Signed)
Patient ID: [name], female   DOB: 1980/11/08, 35 y.o.   MRN: [id number] Pt with general malaise and fatigue and cramping. Advised no discharge to home today as would rather see her feeling better first   VSS  ABD- soft, FF, lochia appropriate   A/P: PPD# 1 - routine pp care

## 2016-03-23 MED ORDER — FAMOTIDINE 20 MG PO TABS
20.0000 mg | ORAL_TABLET | Freq: Every day | ORAL | Status: DC
  Administered 2016-03-23: 20 mg via ORAL
  Filled 2016-03-23: qty 1

## 2016-03-23 MED ORDER — IBUPROFEN 800 MG PO TABS: 800.0000 mg | ORAL_TABLET | Freq: Three times a day (TID) | ORAL | 1 refills | Status: DC | PRN

## 2016-03-23 MED ORDER — PRENATAL MULTIVITAMIN CH: 1.0000 | ORAL_TABLET | Freq: Every day | ORAL | 3 refills | Status: DC

## 2016-03-23 NOTE — Progress Notes (Addendum)
Post Partum Day 2 Subjective: no complaints, up ad lib, voiding, tolerating PO and nl lochia, pain controlled.  Occasional chest pressure, denies anxiety.  Had in pregnancy.  Thinks was worse with ctx.  D/W pt uterine contracting.  Also GERD.  Will give meds for gerd to see if helps  Objective: Blood pressure 118/66, pulse 66, temperature 98.6 F (37 C), temperature source Oral, resp. rate 18, height 5\' 2"  (1.575 m), weight 79.8 kg (176 lb), SpO2 100 %, unknown if currently breastfeeding.  Physical Exam:  General: alert and no distress Lochia: appropriate Uterine Fundus: firm   Recent Labs  03/21/16 0535 03/22/16 0644  HGB 14.1 11.7*  HCT 41.3 35.3*    Assessment/Plan: Discharge home and Breastfeeding.  D/c with motrin and PNV.  F/U 6 weeks.  Prilosec prior to d/c.     LOS: 2 days   Bovard-Stuckert, Tra Wilemon 03/23/2016, 8:18 AM

## 2016-03-23 NOTE — Lactation Note (Addendum)
This note was copied from a baby's chart. Lactation Consultation Note  Ex BF, P4.  Denies problems or concerns regarding breastfeeding. Encouraged longer feedings.  Discussed pacifier use. Reviewed engorgement care and monitoring voids/stools. Mom encouraged to feed baby 8-12 times/24 hours and with feeding cues.     Patient Name: [name] NWGNF'AToday's Date: 03/23/2016     Maternal Data    Feeding Feeding Type: Breast Fed Length of feed: 20 min  LATCH Score/Interventions                      Lactation Tools Discussed/Used     Consult Status      Emily Small, Emily Small 03/23/2016, 9:39 AM

## 2016-03-23 NOTE — Discharge Summary (Signed)
OB Discharge Summary     Patient Name: Emily Small DOB: [birth year] MRN: [id number]  Date of admission: 03/21/2016 Delivering MD: Huel CoteICHARDSON, KATHY   Date of discharge: 03/23/2016  Admitting diagnosis: 39 WEEKS CTX Intrauterine pregnancy: 3348w2d     Secondary diagnosis:  Active Problems:   Normal labor and delivery   NSVD (normal spontaneous vaginal delivery)  Additional problems: N/A     Discharge diagnosis: Term Pregnancy Delivered                                                                                                Post partum procedures:N/A  Augmentation: AROM  Complications: None  Hospital course:  Onset of Labor With Vaginal Delivery     35 y.o. yo [id number] at 7948w2d was admitted in Active Labor on 03/21/2016. Patient had an uncomplicated labor course as follows:  Membrane Rupture Time/Date: 10:31 AM ,03/21/2016   Intrapartum Procedures: Episiotomy: None [1]                                         Lacerations:  1st degree [2]  Patient had a delivery of a Viable infant. 03/21/2016  Information for the patient's newborn:  Pleas KochSkeel, Boy Annesha [[id number]][[id number]]  Delivery Method: Vaginal, Spontaneous Delivery (Filed from Delivery Summary)    Pateint had an uncomplicated postpartum course.  She is ambulating, tolerating a regular diet, passing flatus, and urinating well. Patient is discharged home in stable condition on 03/23/16.    Physical exam Vitals:   03/21/16 2104 03/22/16 0522 03/22/16 1842 03/23/16 0533  BP: 129/76 (!) 146/66 121/76 118/66  Pulse: 95 79 75 66  Resp: 16 18 18    Temp: 97.9 F (36.6 C) 98.2 F (36.8 C) 97.9 F (36.6 C) 98.6 F (37 C)  TempSrc: Oral Oral Oral Oral  SpO2:      Weight:      Height:       General: alert and no distress Lochia: appropriate Uterine Fundus: firm  Labs: Lab Results  Component Value Date   WBC 10.1 03/22/2016   HGB 11.7 (L) 03/22/2016   HCT 35.3 (L) 03/22/2016   MCV 87.8 03/22/2016   PLT 156 03/22/2016   No  flowsheet data found.  Discharge instruction: per After Visit Summary and "Baby and Me Booklet".  After visit meds:    Medication List    TAKE these medications   calcium carbonate 500 MG chewable tablet Commonly known as:  TUMS - dosed in mg elemental calcium Chew 1 tablet by mouth daily.   docusate sodium 100 MG capsule Commonly known as:  COLACE Take 100 mg by mouth 2 (two) times daily.   ferrous sulfate 325 (65 FE) MG tablet Take 325 mg by mouth daily with breakfast.   ibuprofen 800 MG tablet Commonly known as:  ADVIL,MOTRIN Take 1 tablet (800 mg total) by mouth every 8 (eight) hours as needed for moderate pain.   prenatal multivitamin Tabs tablet Take 1 tablet by mouth daily at 12  noon.       Diet: routine diet  Activity: Advance as tolerated. Pelvic rest for 6 weeks.   Outpatient follow up:6 weeks Follow up Appt:No future appointments. Follow up Visit:No Follow-up on file.  Postpartum contraception: vaginal film/foam; possibly vasectomy  Newborn Data: Live born female  Birth Weight: 8 lb 1.5 oz (3670 g) APGAR: 8, 9  Baby Feeding: Breast Disposition:home with mother   03/23/2016 Sherian Rein, MD

## 2019-04-22 ENCOUNTER — Observation Stay (HOSPITAL_COMMUNITY)
Admission: EM | Admit: 2019-04-22 | Discharge: 2019-04-23 | Disposition: A | Discharge status: Home / Self Care | Payer: Medicaid Other | Attending: Internal Medicine | Admitting: Internal Medicine

## 2019-04-22 ENCOUNTER — Encounter (HOSPITAL_COMMUNITY): Payer: Self-pay | Admitting: Emergency Medicine

## 2019-04-22 ENCOUNTER — Observation Stay (HOSPITAL_COMMUNITY): Payer: Medicaid Other

## 2019-04-22 ENCOUNTER — Other Ambulatory Visit: Payer: Self-pay

## 2019-04-22 DIAGNOSIS — F419 Anxiety disorder, unspecified: Secondary | ICD-10-CM | POA: Diagnosis not present

## 2019-04-22 DIAGNOSIS — D509 Iron deficiency anemia, unspecified: Secondary | ICD-10-CM | POA: Diagnosis not present

## 2019-04-22 DIAGNOSIS — F17290 Nicotine dependence, other tobacco product, uncomplicated: Secondary | ICD-10-CM | POA: Insufficient documentation

## 2019-04-22 DIAGNOSIS — N920 Excessive and frequent menstruation with regular cycle: Secondary | ICD-10-CM | POA: Diagnosis not present

## 2019-04-22 DIAGNOSIS — Z0184 Encounter for antibody response examination: Secondary | ICD-10-CM | POA: Diagnosis not present

## 2019-04-22 DIAGNOSIS — R5383 Other fatigue: Secondary | ICD-10-CM | POA: Insufficient documentation

## 2019-04-22 DIAGNOSIS — R42 Dizziness and giddiness: Secondary | ICD-10-CM | POA: Insufficient documentation

## 2019-04-22 DIAGNOSIS — D649 Anemia, unspecified: Secondary | ICD-10-CM

## 2019-04-22 DIAGNOSIS — Z20828 Contact with and (suspected) exposure to other viral communicable diseases: Secondary | ICD-10-CM | POA: Insufficient documentation

## 2019-04-22 DIAGNOSIS — N939 Abnormal uterine and vaginal bleeding, unspecified: Secondary | ICD-10-CM | POA: Diagnosis not present

## 2019-04-22 DIAGNOSIS — R002 Palpitations: Secondary | ICD-10-CM | POA: Diagnosis not present

## 2019-04-22 LAB — RETICULOCYTES
Immature Retic Fract: 7.5 % (ref 2.3–15.9)
RBC.: 3.56 MIL/uL — ABNORMAL LOW (ref 3.87–5.11)
Retic Count, Absolute: 45.9 10*3/uL (ref 19.0–186.0)
Retic Ct Pct: 1.3 % (ref 0.4–3.1)

## 2019-04-22 LAB — SARS CORONAVIRUS 2 (TAT 6-24 HRS): SARS Coronavirus 2: NEGATIVE

## 2019-04-22 LAB — CBC
HCT: 22.9 % — ABNORMAL LOW (ref 36.0–46.0)
Hemoglobin: 5.7 g/dL — CL (ref 12.0–15.0)
MCH: 15.7 pg — ABNORMAL LOW (ref 26.0–34.0)
MCHC: 24.9 g/dL — ABNORMAL LOW (ref 30.0–36.0)
MCV: 63.1 fL — ABNORMAL LOW (ref 80.0–100.0)
Platelets: 358 10*3/uL (ref 150–400)
RBC: 3.63 MIL/uL — ABNORMAL LOW (ref 3.87–5.11)
RDW: 21.2 % — ABNORMAL HIGH (ref 11.5–15.5)
WBC: 5 10*3/uL (ref 4.0–10.5)
nRBC: 0 % (ref 0.0–0.2)

## 2019-04-22 LAB — COMPREHENSIVE METABOLIC PANEL
ALT: 13 U/L (ref 0–44)
AST: 19 U/L (ref 15–41)
Albumin: 4.1 g/dL (ref 3.5–5.0)
Alkaline Phosphatase: 50 U/L (ref 38–126)
Anion gap: 9 (ref 5–15)
BUN: 5 mg/dL — ABNORMAL LOW (ref 6–20)
CO2: 23 mmol/L (ref 22–32)
Calcium: 9.1 mg/dL (ref 8.9–10.3)
Chloride: 106 mmol/L (ref 98–111)
Creatinine, Ser: 0.68 mg/dL (ref 0.44–1.00)
GFR calc Af Amer: >60 mL/min (ref >60)
GFR calc non Af Amer: >60 mL/min (ref >60)
Glucose, Bld: 113 mg/dL — ABNORMAL HIGH (ref 70–99)
Potassium: 3.8 mmol/L (ref 3.5–5.1)
Sodium: 138 mmol/L (ref 135–145)
Total Bilirubin: 0.4 mg/dL (ref 0.3–1.2)
Total Protein: 6.7 g/dL (ref 6.5–8.1)

## 2019-04-22 LAB — ABO/RH: ABO/RH(D): A POS

## 2019-04-22 LAB — POC OCCULT BLOOD, ED: Fecal Occult Bld: NEGATIVE

## 2019-04-22 LAB — FOLATE: Folate: 30.3 ng/mL (ref >5.9)

## 2019-04-22 LAB — I-STAT BETA HCG BLOOD, ED (MC, WL, AP ONLY): I-stat hCG, quantitative: <5 m[IU]/mL (ref <5)

## 2019-04-22 LAB — FERRITIN: Ferritin: 2 ng/mL — ABNORMAL LOW (ref 11–307)

## 2019-04-22 LAB — VITAMIN B12: Vitamin B-12: 494 pg/mL (ref 180–914)

## 2019-04-22 LAB — IRON AND TIBC
Iron: 7 ug/dL — ABNORMAL LOW (ref 28–170)
Saturation Ratios: 1 % — ABNORMAL LOW (ref 10.4–31.8)
TIBC: 531 ug/dL — ABNORMAL HIGH (ref 250–450)
UIBC: 524 ug/dL

## 2019-04-22 LAB — PREPARE RBC (CROSSMATCH)

## 2019-04-22 MED ORDER — SODIUM CHLORIDE 0.9 % IV SOLN
510.0000 mg | Freq: Once | INTRAVENOUS | Status: AC
  Administered 2019-04-23: 510 mg via INTRAVENOUS
  Filled 2019-04-22 (×3): qty 17

## 2019-04-22 MED ORDER — ACETAMINOPHEN 650 MG RE SUPP: 650.0000 mg | Freq: Four times a day (QID) | RECTAL | Status: DC | PRN

## 2019-04-22 MED ORDER — ACETAMINOPHEN 325 MG PO TABS
650.0000 mg | ORAL_TABLET | Freq: Four times a day (QID) | ORAL | Status: DC | PRN
  Administered 2019-04-22: 650 mg via ORAL
  Filled 2019-04-22: qty 2

## 2019-04-22 MED ORDER — SODIUM CHLORIDE 0.9% FLUSH
3.0000 mL | Freq: Two times a day (BID) | INTRAVENOUS | Status: DC
  Administered 2019-04-23: 3 mL via INTRAVENOUS

## 2019-04-22 MED ORDER — ONDANSETRON HCL 4 MG/2ML IJ SOLN: 4.0000 mg | Freq: Four times a day (QID) | INTRAMUSCULAR | Status: DC | PRN

## 2019-04-22 MED ORDER — SODIUM CHLORIDE 0.9 % IV SOLN: 10.0000 mL/h | Freq: Once | INTRAVENOUS | Status: DC

## 2019-04-22 MED ORDER — PRENATAL MULTIVITAMIN CH
1.0000 | ORAL_TABLET | Freq: Every day | ORAL | Status: DC
  Administered 2019-04-23: 1 via ORAL
  Filled 2019-04-22: qty 1

## 2019-04-22 MED ORDER — ALBUTEROL SULFATE (2.5 MG/3ML) 0.083% IN NEBU: 2.5000 mg | INHALATION_SOLUTION | Freq: Four times a day (QID) | RESPIRATORY_TRACT | Status: DC | PRN

## 2019-04-22 MED ORDER — ONDANSETRON HCL 4 MG PO TABS: 4.0000 mg | ORAL_TABLET | Freq: Four times a day (QID) | ORAL | Status: DC | PRN

## 2019-04-22 NOTE — H&P (Signed)
History and Physical    Emily Small:[id number] DOB: Mar 02, 1981 DOA: 04/22/2019  Referring MD/NP/PA: Benedetto Goad, PA-C PCP: Patient, No Pcp Per  Patient coming from: Home  Chief Complaint: abnormal lab work  I have personally briefly reviewed patient's old medical records in [place]   HPI: Emily Small is a 38 y.o. female with medical history significant of anxiety and menorrhagia; who presents with complaints of abnormal lab work.  Patient follow-up with her primary care provider yesterday and had routine lab obtained which showed hemoglobin as low as 5.3 for which she was instructed to come to the emergency department.  Patient reports that she has been feeling fatigued, lightheaded/dizzy, and anxious at times.  She had been off of any kind of iron supplementation, but had recently restarted taking a multivitamin with iron in the last week or so.  Denies any prior history of needing blood transfusions in the past.  Her periods are usually regular lasting approximately 5 days.  The second day of her menstrual cycle is usually heavy and she uses up to 8 super tampons in a day, but on all other days goes through 1 or 2.  Denies having any recent loss of consciousness, trauma, headache, focal weakness, nausea, vomiting, diarrhea, or blood in stools to her knowledge.  Patient does admit to vaping, but denies any routine use of alcohol or illicit drugs.  She does not have a OB/GYN and usually receives all of her care from her primary care provider.  ED Course: Upon admission into the emergency department patient was noted to be afebrile with vital signs relatively within normal limits.  Labs significant for hemoglobin of 5.7 with low MCV and MCH.  Stool guaiacs were noted to be negative.  Patient was typed and screened for need of blood products and ordered to be transfused 1 unit of packed red blood cells.  TRH called to admit.  Review of Systems  Constitutional: Positive for  malaise/fatigue. Negative for weight loss.  HENT: Negative for ear discharge and sinus pain.   Eyes: Negative for photophobia and pain.  Respiratory: Negative for cough and shortness of breath.   Cardiovascular: Positive for palpitations. Negative for chest pain and leg swelling.  Gastrointestinal: Negative for abdominal pain, diarrhea, melena, nausea and vomiting.  Genitourinary: Negative for dysuria and hematuria.  Skin: Negative for itching.  Neurological: Positive for dizziness. Negative for focal weakness.  Psychiatric/Behavioral: Negative for substance abuse. The patient is not nervous/anxious.     Past Medical History:  Diagnosis Date  . Anxiety     History reviewed. No pertinent surgical history.   reports that she has been smoking e-cigarettes. She has never used smokeless tobacco. She reports current alcohol use of about 12.0 standard drinks of alcohol per week. She reports that she does not use drugs.  No Known Allergies  Family History  Problem Relation Age of Onset  . Heart disease Mother   . Hyperlipidemia Mother     Prior to Admission medications   Medication Sig Start Date End Date Taking? Authorizing Provider  ibuprofen (ADVIL,MOTRIN) 800 MG tablet Take 1 tablet (800 mg total) by mouth every 8 (eight) hours as needed for moderate pain. Patient not taking: Reported on 04/22/2019 03/23/16   Bovard-Stuckert, Jeral Fruit, MD  Prenatal Vit-Fe Fumarate-FA (PRENATAL MULTIVITAMIN) TABS tablet Take 1 tablet by mouth daily at 12 noon. Patient not taking: Reported on 04/22/2019 03/23/16   Janyth Contes, MD    Physical Exam:  Constitutional: Female  who appears to be NAD, calm, comfortable Vitals:   04/22/19 0935 04/22/19 0943 04/22/19 1345  BP: 139/80  118/65  Pulse: 100  87  Resp: 18  18  Temp: 99 F (37.2 C)    TempSrc: Oral    SpO2: 100%  100%  Weight:  68 kg   Height:  5\' 2"  (1.575 m)    Eyes: PERRL, lids and conjunctivae normal ENMT: Mucous membranes are  moist. Posterior pharynx clear of any exudate or lesions.  Neck: normal, supple, no masses, no thyromegaly Respiratory: clear to auscultation bilaterally, no wheezing, no crackles. Normal respiratory effort. No accessory muscle use.  Cardiovascular: Mildly tachycardia, no murmurs / rubs / gallops. No extremity edema. 2+ pedal pulses. No carotid bruits.  Abdomen: no tenderness, no masses palpated. No hepatosplenomegaly. Bowel sounds positive.  Musculoskeletal: no clubbing / cyanosis. No joint deformity upper and lower extremities. Good ROM, no contractures. Normal muscle tone.  Skin: Pallor present.  No rashes, lesions, ulcers. No induration Neurologic: CN 2-12 grossly intact. Sensation intact, DTR normal. Strength 5/5 in all 4.  Psychiatric: Normal judgment and insight. Alert and oriented x 3. Normal mood.     Labs on Admission: I have personally reviewed following labs and imaging studies  CBC: Recent Labs  Lab 04/22/19 0944  WBC 5.0  HGB 5.7*  HCT 22.9*  MCV 63.1*  PLT 358   Basic Metabolic Panel: Recent Labs  Lab 04/22/19 0944  NA 138  K 3.8  CL 106  CO2 23  GLUCOSE 113*  BUN 5*  CREATININE 0.68  CALCIUM 9.1   GFR: Estimated Creatinine Clearance: 86.2 mL/min (by C-G formula based on SCr of 0.68 mg/dL). Liver Function Tests: Recent Labs  Lab 04/22/19 0944  AST 19  ALT 13  ALKPHOS 50  BILITOT 0.4  PROT 6.7  ALBUMIN 4.1   No results for input(s): LIPASE, AMYLASE in the last 168 hours. No results for input(s): AMMONIA in the last 168 hours. Coagulation Profile: No results for input(s): INR, PROTIME in the last 168 hours. Cardiac Enzymes: No results for input(s): CKTOTAL, CKMB, CKMBINDEX, TROPONINI in the last 168 hours. BNP (last 3 results) No results for input(s): PROBNP in the last 8760 hours. HbA1C: No results for input(s): HGBA1C in the last 72 hours. CBG: No results for input(s): GLUCAP in the last 168 hours. Lipid Profile: No results for input(s):  CHOL, HDL, LDLCALC, TRIG, CHOLHDL, LDLDIRECT in the last 72 hours. Thyroid Function Tests: No results for input(s): TSH, T4TOTAL, FREET4, T3FREE, THYROIDAB in the last 72 hours. Anemia Panel: Recent Labs    04/22/19 1235  VITAMINB12 494  FERRITIN 2*  TIBC 531*  IRON 7*  RETICCTPCT 1.3   Urine analysis:    Component Value Date/Time   BILIRUBINUR negative 03/03/2015 1443   PROTEINUR trace 03/03/2015 1443   UROBILINOGEN 0.2 03/03/2015 1443   NITRITE negative 03/03/2015 1443   LEUKOCYTESUR Negative 03/03/2015 1443   Sepsis Labs: No results found for this or any previous visit (from the past 240 hour(s)).   Radiological Exams on Admission: No results found.  EKG: Independently reviewed.  Sinus tachycardia 103 bpm  Assessment/Plan Microcytic anemia: Acute.  Patient presents with hemoglobin 5.3 with low MCV and MCH.  Stool guaiacs negative.  Suspect secondary to heavy menstrual periods. -Admit to a medical telemetry -Anemia panel was significant for signs of iron deficiency -Continue with transfusion of 2 units of packed red blood cells -Feraheme ordered for in a.m. -Recommended patient to take 325  mg of ferrous sulfate twice daily with vitamin C 500 mg to help decrease GI discomfort at home -Continue to monitor and transfuse blood products as needed -Appreciate OB/GYN consultative services, we will follow-up further  Suspected Menorrhagia: Patient notes have regular menstrual periods, but heavy bleeding on some days.  She had not been on iron supplementation in the outpatient setting. -OB/GYN recommending Provera 10 mg to start with her next. -Outpatient follow-up to be arranged by OB/GYN  COVID-19 screening: Pending  DVT prophylaxis: SCD Code Status: Full Family Communication: Discussed plan of care with the patient and her husband present at bedside Disposition Plan: Likely discharge home in a.m. Consults called: OB/GYN Admission status: *Observation  Clydie Braunondell A Smith  MD [facility] Pager [phone]   If 7PM-7AM, please contact night-coverage [email or web address] Password [email or web address]  04/22/2019, 2:16 PM

## 2019-04-22 NOTE — ED Notes (Signed)
Patient transported to Ultrasound 

## 2019-04-22 NOTE — ED Notes (Signed)
Dinner tray ordered.

## 2019-04-22 NOTE — ED Notes (Signed)
Dinner Tray Ordered @ 1705. 

## 2019-04-22 NOTE — Progress Notes (Signed)
Patient received from the ER via stretcher.  Alert and oriented.  Ambulated to the BR without difficulty upon admission.

## 2019-04-22 NOTE — Consult Note (Signed)
OB/GYN Consult Note  Referring Provider: Dr. Madelyn Flavorsondell Smith  Emily Small is a 38 y.o. [phone] admitted for symptomatic anemia. She was seen at PCP yesterday for routine exam where she reported dizziness, palpitations, fatigue for "a while", states she had bloodwork done then received a call this am that she needed to present to ED for blood transfusion. No acute issues. States she has had these symptoms for a while, not really any worse recently. States she was given iron during her last pregnancy but stopped it after pregnancy, has not ever been diagnosed with anemia outside of pregnancy. States she has anxiety and felt that some of her dizziness and racing heart was attributed to that. Has been on celexa for several years, recently stopped. Started on metoprolol yesterday by PCP.  Gyn consulted for concern for AUB as cause of symptomatic anemia. Patient reports monthly periods, lasting 5 days with only one day being heavy. Does not feel her periods are really heavy and they are much lighter than when she was a teenager. Previously used IUD with cessation of bleeding. Has not been on contraception since delivery of child 3 years ago, has had period for last two years. Has not had pap in 3 years, reports all have been normal.  She is not currently bleeding.      Past Medical History:  Diagnosis Date  . Anxiety     History reviewed. No pertinent surgical history.  OB History  Gravida Para Term Preterm AB Living  4 4 4  0 0 4  SAB TAB Ectopic Multiple Live Births  0 0 0 0 4    # Outcome Date GA Lbr Len/2nd Weight Sex Delivery Anes PTL Lv  4 Term 03/21/16 [id number] 05:26 / 00:17 3670 g M Vag-Spont EPI  LIV  3 Term      Vag-Spont   LIV  2 Term      Vag-Spont   LIV  1 Term      Vag-Spont   LIV    Social History   Socioeconomic History  . Marital status: Married    Spouse name: Not on file  . Number of children: Not on file  . Years of education: Not on file  . Highest education  level: Not on file  Occupational History  . Not on file  Social Needs  . Financial resource strain: Not on file  . Food insecurity    Worry: Not on file    Inability: Not on file  . Transportation needs    Medical: Not on file    Non-medical: Not on file  Tobacco Use  . Smoking status: Current Every Day Smoker    Types: E-cigarettes  . Smokeless tobacco: Never Used  Substance and Sexual Activity  . Alcohol use: Yes    Alcohol/week: 12.0 standard drinks    Types: 12 Cans of beer per week  . Drug use: No  . Sexual activity: Yes  Lifestyle  . Physical activity    Days per week: Not on file    Minutes per session: Not on file  . Stress: Not on file  Relationships  . Social Musicianconnections    Talks on phone: Not on file    Gets together: Not on file    Attends religious service: Not on file    Active member of club or organization: Not on file    Attends meetings of clubs or organizations: Not on file    Relationship status: Not on file  Other  Topics Concern  . Not on file  Social History Narrative  . Not on file    Family History  Problem Relation Age of Onset  . Heart disease Mother   . Hyperlipidemia Mother     (Not in a hospital admission)   No Known Allergies  Review of Systems: Negative except for what is mentioned in HPI.     Physical Exam: BP 123/71 (BP Location: Left Arm)   Pulse 90   Temp 98.3 F (36.8 C) (Oral)   Resp 17   Ht 5\' 2"  (1.575 m)   Wt 68 kg   LMP 04/14/2019   SpO2 100%   BMI 27.44 kg/m  CONSTITUTIONAL: Well-developed, well-nourished female in no acute distress.  HENT:  Normocephalic, atraumatic, External right and left ear normal. Oropharynx is clear and moist EYES: Conjunctivae and EOM are normal. Pupils are equal, round, and reactive to light. No scleral icterus.  NECK: Normal range of motion, supple, no masses SKIN: Skin is warm and dry. No rash noted. Not diaphoretic. No erythema. No pallor. NEUROLGIC: Alert and oriented to  person, place, and time. Normal reflexes, muscle tone coordination. No cranial nerve deficit noted. PSYCHIATRIC: Normal mood and affect. Normal behavior. Normal judgment and thought content. CARDIOVASCULAR: Normal heart rate noted RESPIRATORY: Effort normal, no problems with respiration noted ABDOMEN: Soft, nontender, nondistended, gravid.  PELVIC: deferred MUSCULOSKELETAL: Normal range of motion. No edema and no tenderness. 2+ distal pulses.  Pertinent Labs/Studies:   Results for orders placed or performed during the hospital encounter of 04/22/19 (from the past 72 hour(s))  Comprehensive metabolic panel     Status: Abnormal   Collection Time: 04/22/19  9:44 AM  Result Value Ref Range   Sodium 138 135 - 145 mmol/L   Potassium 3.8 3.5 - 5.1 mmol/L   Chloride 106 98 - 111 mmol/L   CO2 23 22 - 32 mmol/L   Glucose, Bld 113 (H) 70 - 99 mg/dL   BUN 5 (L) 6 - 20 mg/dL   Creatinine, Ser 1.15 0.44 - 1.00 mg/dL   Calcium 9.1 8.9 - 52.0 mg/dL   Total Protein 6.7 6.5 - 8.1 g/dL   Albumin 4.1 3.5 - 5.0 g/dL   AST 19 15 - 41 U/L   ALT 13 0 - 44 U/L   Alkaline Phosphatase 50 38 - 126 U/L   Total Bilirubin 0.4 0.3 - 1.2 mg/dL   GFR calc non Af Amer >60 >60 mL/min   GFR calc Af Amer >60 >60 mL/min   Anion gap 9 5 - 15    Comment: Performed at [facility], [address] [place], [place] [address]  CBC     Status: Abnormal   Collection Time: 04/22/19  9:44 AM  Result Value Ref Range   WBC 5.0 4.0 - 10.5 K/uL   RBC 3.63 (L) 3.87 - 5.11 MIL/uL   Hemoglobin 5.7 (LL) 12.0 - 15.0 g/dL    Comment: REPEATED TO VERIFY Reticulocyte Hemoglobin testing may be clinically indicated, consider ordering this additional test [id number] THIS CRITICAL RESULT HAS VERIFIED AND BEEN CALLED TO H DOOLEY,RN BY KIM COLVIN ON 09 09 2020 AT 1046, AND HAS BEEN READ BACK.     HCT 22.9 (L) 36.0 - 46.0 %   MCV 63.1 (L) 80.0 - 100.0 fL   MCH 15.7 (L) 26.0 - 34.0 pg   MCHC 24.9 (L) 30.0 - 36.0 g/dL   RDW 75.3 (H)  00.5 - 15.5 %   Platelets 358  150 - 400 K/uL   nRBC 0.0 0.0 - 0.2 %    Comment: Performed at [facility], [address] [place], [place] [address]  Type and screen [facility]     Status: None (Preliminary result)   Collection Time: 04/22/19 10:08 AM  Result Value Ref Range   ABO/RH(D) A POS    Antibody Screen NEG    Sample Expiration 04/25/2019,2359    Unit Number [id number]    Blood Component Type RED CELLS,LR    Unit division 00    Status [facility]    Transfusion Status PENDING    Crossmatch Result PENDING    Unit Number [id number]    Blood Component Type RED CELLS,LR    Unit division 00    Status of [facility]    Transfusion Status PENDING    Crossmatch Result PENDING    Unit Number [id number]    Blood Component Type RED CELLS,LR    Unit division 00    Status of Unit ALLOCATED    Transfusion Status OK TO TRANSFUSE    Crossmatch Result Compatible    Unit Number [id number]    Blood Component Type RED CELLS,LR    Unit division 00    Status of Unit ISSUED    Transfusion Status OK TO TRANSFUSE    Crossmatch Result      Compatible Performed at [facility], [address] [place], [place] [address]   ABO/Rh     Status: None   Collection Time: 04/22/19 10:08 AM  Result Value Ref Range   ABO/RH(D)      A POS Performed at [facility], [address] [place], [place] [address]   I-Stat beta hCG blood, ED     Status: None   Collection Time: 04/22/19 10:32 AM  Result Value Ref Range   I-stat hCG, quantitative <5.0 <5 mIU/mL   Comment 3            Comment:   GEST. AGE      CONC.  (mIU/mL)   <=1 WEEK        5 - 50     2 WEEKS       50 - 500     3 WEEKS       100 - 10,000     4 WEEKS     1,000 - 30,000        FEMALE AND NON-PREGNANT FEMALE:     LESS THAN 5 mIU/mL   Vitamin B12     Status: None   Collection Time: 04/22/19 12:35 PM  Result Value Ref Range   Vitamin B-12 494 180 - 914 pg/mL     Comment: (NOTE) This assay is not validated for testing neonatal or myeloproliferative syndrome specimens for Vitamin B12 levels. Performed at [facility], [address] [place], [place] [address]   Iron and TIBC     Status: Abnormal   Collection Time: 04/22/19 12:35 PM  Result Value Ref Range   Iron 7 (L) 28 - 170 ug/dL   TIBC 756531 (H) 433250 - 295450 ug/dL   Saturation Ratios 1 (L) 10.4 - 31.8 %   UIBC 524 ug/dL    Comment: Performed at [facility], [address] [place], [place] [address]  Ferritin     Status: Abnormal   Collection Time: 04/22/19 12:35 PM  Result Value Ref Range   Ferritin 2 (  L) 11 - 307 ng/mL    Comment: Performed at [facility], [place] [address], [place], [place] [address]  Reticulocytes     Status: Abnormal   Collection Time: 04/22/19 12:35 PM  Result Value Ref Range   Retic Ct Pct 1.3 0.4 - 3.1 %   RBC. 3.56 (L) 3.87 - 5.11 MIL/uL   Retic Count, Absolute 45.9 19.0 - 186.0 K/uL   Immature Retic Fract 7.5 2.3 - 15.9 %    Comment: Performed at [place] [address], [place], [place] [address]  Prepare RBC     Status: None   Collection Time: 04/22/19 12:35 PM  Result Value Ref Range   Order Confirmation      ORDER PROCESSED BY BLOOD BANK Performed at [facility], [place] [address], [place], [place] [address]   Folate     Status: None   Collection Time: 04/22/19 12:35 PM  Result Value Ref Range   Folate 30.3 >5.9 ng/mL    Comment: Performed at [facility], [place] [address] [place], [place] [address]  POC occult blood, ED     Status: None   Collection Time: 04/22/19  1:23 PM  Result Value Ref Range   Fecal Occult Bld NEGATIVE NEGATIVE       Assessment and Plan :Emily Small is a 38 y.o. [id number] admitted for blood transfusion for symptomatic anemia, felt possibly to be secondary to heavy menses. Patient reports regular periods with only one heavy day. Reports she has had symptoms of dizziness and racing heart for  a while. Although her periods do not sound overly heavy, over the course of time, if she is iron deficient, certainly this could be a contributing factor to her anemia. Reviewed options for management including OCPs, IUD, PO meds and non-hormonal options. Reviewed would recommend TVUS while in patient (has been ordered) and plan for follow up as outpatient to discuss long term plan. Reviewed recommendation for POl med to take during her periods in the mean time. She is agreeable to plan.  Will f/u TVUS Patient stable for discharge from GYN standpoint, recommend she be sent home with - Provera 10 mg daily to start with her next period Will have office contact her for follow up appointment   Thank you for this consult, we will follow along, please call with further questions.   For OB/GYN questions, please call the [facility] [place] Monday - Friday, 8 am - 5 pm: [phone] All other times: [phone]   Total face-to-face time with patient: 30 minutes. Over 50% of encounter was spent on counseling and coordination of care.    Feliz Beam, M.D. Attending [place], [facility] for [employer], [place]

## 2019-04-22 NOTE — ED Triage Notes (Signed)
Pt states her MD called and told her that her Hbg was 5.3 and was sent here. Pt has been having palpitations, fatigue for a few months. Denies any source of bleeding. Denies CP/SOB

## 2019-04-22 NOTE — ED Provider Notes (Addendum)
[facility]ENC[facility] Provider Note   CSN: [id number] Arrival date & time: 04/22/19  0908     History   Chief Complaint Chief Complaint  Patient presents with  . Anemia    HPI Emily Small is a 38 y.o. female.     Emily Small is a 38 y.o. female with a history of anxiety, who presents to the ED for evaluation of low hemoglobin.  She reports she went to her primary doctor for routine visit, was called this morning and told that her hemoglobin was 5.3 and sent to the emergency department.  She reports that she had been told once before that she had mild anemia while she was pregnant 3 years ago, has never required a blood transfusion.  She reports that she has heavy menstrual cycles, last menstrual cycle was last week, she reports on some days during her cycle she has to use a new tampon every hour, but denies any other bleeding symptoms.  No rectal bleeding or blood noted in her stools.  No bloody emesis.  No blood in her urine.  She denies any easy bleeding or bruising.  She is not on any iron supplementation.  No recent surgeries.  Reports that she has been feeling fatigued and has had some lightheadedness upon standing more recently, with exertion she has some mild shortness of breath, no chest pain.  She does report occasional palpitations.  No other aggravating or alleviating factors.     Past Medical History:  Diagnosis Date  . Anxiety     Patient Active Problem List   Diagnosis Date Noted  . Normal labor and delivery 03/21/2016  . NSVD (normal spontaneous vaginal delivery) 03/21/2016    History reviewed. No pertinent surgical history.   OB History    Gravida  4   Para  4   Term  4   Preterm  0   AB  0   Living  4     SAB  0   TAB  0   Ectopic  0   Multiple  0   Live Births  4            Home Medications    Prior to Admission medications   Medication Sig Start Date End Date Taking? Authorizing Provider   ibuprofen (ADVIL,MOTRIN) 800 MG tablet Take 1 tablet (800 mg total) by mouth every 8 (eight) hours as needed for moderate pain. Patient not taking: Reported on 04/22/2019 03/23/16   Bovard-Stuckert, Augusto GambleJody, MD  Prenatal Vit-Fe Fumarate-FA (PRENATAL MULTIVITAMIN) TABS tablet Take 1 tablet by mouth daily at 12 noon. Patient not taking: Reported on 04/22/2019 03/23/16   Sherian ReinBovard-Stuckert, Jody, MD    Family History Family History  Problem Relation Age of Onset  . Heart disease Mother   . Hyperlipidemia Mother     Social History Social History   Tobacco Use  . Smoking status: Current Every Day Smoker    Types: E-cigarettes  . Smokeless tobacco: Never Used  Substance Use Topics  . Alcohol use: Yes    Alcohol/week: 12.0 standard drinks    Types: 12 Cans of beer per week  . Drug use: No     Allergies   Patient has no known allergies.   Review of Systems Review of Systems  Constitutional: Positive for fatigue. Negative for chills and fever.  Respiratory: Negative for cough and shortness of breath.   Cardiovascular: Positive for palpitations. Negative for chest pain.  Gastrointestinal: Negative for  abdominal pain, nausea and vomiting.  Genitourinary: Negative for dysuria, flank pain, hematuria and vaginal discharge. Vaginal bleeding: Heavy vaginal bleading during cycles (no current bleeding)  Musculoskeletal: Negative for arthralgias and myalgias.  Skin: Negative for color change and rash.  Neurological: Positive for light-headedness. Negative for dizziness, weakness, numbness and headaches.  All other systems reviewed and are negative.    Physical Exam Updated Vital Signs BP 139/80 (BP Location: Right Arm)   Pulse 100   Temp 99 F (37.2 C) (Oral)   Resp 18   Ht 5\' 2"  (1.575 m)   Wt 68 kg   LMP 04/14/2019   SpO2 100%   BMI 27.44 kg/m   Physical Exam Vitals signs and nursing note reviewed.  Constitutional:      General: She is not in acute distress.    Appearance: Normal  appearance. She is well-developed and normal weight. She is not ill-appearing or diaphoretic.  HENT:     Head: Normocephalic and atraumatic.     Mouth/Throat:     Mouth: Mucous membranes are moist.     Pharynx: Oropharynx is clear.  Eyes:     General:        Right eye: No discharge.        Left eye: No discharge.     Pupils: Pupils are equal, round, and reactive to light.     Comments: Conjunctivae pale  Neck:     Musculoskeletal: Neck supple.  Cardiovascular:     Rate and Rhythm: Normal rate and regular rhythm.     Pulses: Normal pulses.     Heart sounds: Normal heart sounds. No murmur. No friction rub. No gallop.   Pulmonary:     Effort: Pulmonary effort is normal. No respiratory distress.     Breath sounds: Normal breath sounds. No wheezing or rales.     Comments: Respirations equal and unlabored, patient able to speak in full sentences, lungs clear to auscultation bilaterally Abdominal:     General: Bowel sounds are normal. There is no distension.     Palpations: Abdomen is soft. There is no mass.     Tenderness: There is no abdominal tenderness. There is no guarding.     Comments: Abdomen soft, nondistended, nontender to palpation in all quadrants without guarding or peritoneal signs  Musculoskeletal:        General: No deformity.  Skin:    General: Skin is warm and dry.     Capillary Refill: Capillary refill takes less than 2 seconds.  Neurological:     Mental Status: She is alert.     Coordination: Coordination normal.     Comments: Speech is clear, able to follow commands Moves extremities without ataxia, coordination intact  Psychiatric:        Mood and Affect: Mood normal.        Behavior: Behavior normal.      ED Treatments / Results  Labs (all labs ordered are listed, but only abnormal results are displayed) Labs Reviewed  COMPREHENSIVE METABOLIC PANEL - Abnormal; Notable for the following components:      Result Value   Glucose, Bld 113 (*)    BUN 5 (*)     All other components within normal limits  CBC - Abnormal; Notable for the following components:   RBC 3.63 (*)    Hemoglobin 5.7 (*)    HCT 22.9 (*)    MCV 63.1 (*)    MCH 15.7 (*)    MCHC 24.9 (*)  RDW 21.2 (*)    All other components within normal limits  IRON AND TIBC - Abnormal; Notable for the following components:   Iron 7 (*)    TIBC 531 (*)    Saturation Ratios 1 (*)    All other components within normal limits  FERRITIN - Abnormal; Notable for the following components:   Ferritin 2 (*)    All other components within normal limits  RETICULOCYTES - Abnormal; Notable for the following components:   RBC. 3.56 (*)    All other components within normal limits  SARS CORONAVIRUS 2 (TAT 6-24 HRS)  VITAMIN B12  FOLATE  POC OCCULT BLOOD, ED  I-STAT BETA HCG BLOOD, ED ([facility], [facility], [facility] ONLY)  TYPE AND SCREEN  ABO/RH  PREPARE RBC (CROSSMATCH)    EKG EKG Interpretation  Date/Time:  Wednesday April 22 2019 10:14:30 EDT Ventricular Rate:  103 PR Interval:  124 QRS Duration: 82 QT Interval:  348 QTC Calculation: 455 R Axis:   82 Text Interpretation:  Sinus tachycardia Otherwise normal ECG Confirmed by Kennis Carina [phone]) on 04/22/2019 12:31:11 PM   Radiology No results found.  Procedures .Critical Care Performed by: Dartha Lodge, PA-C Authorized by: Dartha Lodge, PA-C   Critical care provider statement:    Critical care time (minutes):  45   Critical care was necessary to treat or prevent imminent or life-threatening deterioration of the following conditions:  Circulatory failure (Anemia requiring transfusion)   Critical care was time spent personally by me on the following activities:  Discussions with consultants, evaluation of patient's response to treatment, examination of patient, ordering and performing treatments and interventions, ordering and review of laboratory studies, ordering and review of radiographic studies, pulse oximetry, re-evaluation of  patient's condition, obtaining history from patient or surrogate and review of old charts   (including critical care time)  Medications Ordered in ED Medications  0.9 %  sodium chloride infusion (has no administration in time range)     Initial Impression / Assessment and Plan / ED Course  I have reviewed the triage vital signs and the nursing notes.  Pertinent labs & imaging results that were available during my care of the patient were reviewed by me and considered in my medical decision making (see chart for details).   Patient presents with symptomatic anemia, hemoglobin of 5.3.  Patient reports heavy menstrual cycles but no other bleeding symptoms, Hemoccult negative.  Has never required blood transfusions previously.  Suspect abnormal uterine bleeding is likely the cause of her anemia.  Anemia panel sent.  Blood transfusion ordered.  No other significant lab abnormalities.  Given significant anemia feel patient would benefit from admission as she may need additional transfusions.  Case discussed with Dr. Katrinka Blazing with [facility] hospitalist who will see and admit the patient, requests OB/GYN consult for recommendations for abnormal bleeding.  Patient is not having any current vaginal bleeding.  Case discussed with Dr. Earlene Plater with OB/GYN, they will see the patient in consult.  Final Clinical Impressions(s) / ED Diagnoses   Final diagnoses:  Symptomatic anemia    ED Discharge Orders    None       Dartha Lodge, [place] 04/22/19 1625    Dartha Lodge, PA-C 04/22/19 1626    Sabas Sous, MD 04/23/19 [phone]

## 2019-04-23 DIAGNOSIS — D509 Iron deficiency anemia, unspecified: Secondary | ICD-10-CM

## 2019-04-23 LAB — CBC WITH DIFFERENTIAL/PLATELET
Abs Immature Granulocytes: 0 10*3/uL (ref 0.00–0.07)
Basophils Absolute: 0.1 10*3/uL (ref 0.0–0.1)
Basophils Relative: 2 %
Eosinophils Absolute: 0.1 10*3/uL (ref 0.0–0.5)
Eosinophils Relative: 1 %
HCT: 29.6 % — ABNORMAL LOW (ref 36.0–46.0)
Hemoglobin: 8.4 g/dL — ABNORMAL LOW (ref 12.0–15.0)
Lymphocytes Relative: 18 %
Lymphs Abs: 1.2 10*3/uL (ref 0.7–4.0)
MCH: 19.6 pg — ABNORMAL LOW (ref 26.0–34.0)
MCHC: 28.4 g/dL — ABNORMAL LOW (ref 30.0–36.0)
MCV: 69.2 fL — ABNORMAL LOW (ref 80.0–100.0)
Monocytes Absolute: 0.2 10*3/uL (ref 0.1–1.0)
Monocytes Relative: 3 %
Neutro Abs: 4.9 10*3/uL (ref 1.7–7.7)
Neutrophils Relative %: 76 %
Platelets: 283 10*3/uL (ref 150–400)
RBC: 4.28 MIL/uL (ref 3.87–5.11)
RDW: 25.8 % — ABNORMAL HIGH (ref 11.5–15.5)
WBC: 6.5 10*3/uL (ref 4.0–10.5)
nRBC: 0 /100 WBC
nRBC: 0.3 % — ABNORMAL HIGH (ref 0.0–0.2)

## 2019-04-23 LAB — TYPE AND SCREEN
ABO/RH(D): A POS
Antibody Screen: NEGATIVE
Unit division: 0
Unit division: 0
Unit division: 0
Unit division: 0

## 2019-04-23 LAB — BPAM RBC
Blood Product Expiration Date: 202010022359
Blood Product Expiration Date: 202010022359
Blood Product Expiration Date: 202010032359
Blood Product Expiration Date: 202010032359
ISSUE DATE / TIME: 202009091406
ISSUE DATE / TIME: 202009091752
ISSUE DATE / TIME: 202009091752
ISSUE DATE / TIME: 202009092120
Unit Type and Rh: 6200
Unit Type and Rh: 6200
Unit Type and Rh: 6200
Unit Type and Rh: 6200

## 2019-04-23 LAB — HIV ANTIBODY (ROUTINE TESTING W REFLEX): HIV Screen 4th Generation wRfx: NONREACTIVE

## 2019-04-23 LAB — TSH: TSH: 1.832 u[IU]/mL (ref 0.350–4.500)

## 2019-04-23 MED ORDER — MEDROXYPROGESTERONE ACETATE 10 MG PO TABS: ORAL_TABLET | ORAL | 0 refills | Status: DC

## 2019-04-23 MED ORDER — IRON (FERROUS SULFATE) 325 (65 FE) MG PO TABS: 325.0000 mg | ORAL_TABLET | Freq: Every day | ORAL | 1 refills | Status: AC

## 2019-04-23 NOTE — Progress Notes (Signed)
Patient have order to transfuse 2 units PRBC's.  Patient received the first unit in ED and it was completed at [facility] on 04/22/2019 in the ED.  Patient received second unit on 5N and it was completed at 0001 on 04/23/2019.

## 2019-04-23 NOTE — Discharge Summary (Addendum)
Physician Discharge Summary  Emily Small JJO:[id number] DOB: [birth year] DOA: 04/22/2019  PCP: Patient, No Pcp Per  Admit date: 04/22/2019 Discharge date: 04/23/2019  Admitted From: home Discharge disposition: home   Recommendations for Outpatient Follow-Up:   Follow CBC Outpatient GYN follow up  Discharge Diagnosis:   Principal Problem:   Microcytic anemia Active Problems:   Menorrhagia    Discharge Condition: Improved.  Diet recommendation:   Regular.  Wound care: None.  Code status: Full.   History of Present Illness:   Emily Small is a 38 y.o. female with medical history significant of anxiety and menorrhagia; who presents with complaints of abnormal lab work.  Patient follow-up with her primary care provider yesterday and had routine lab obtained which showed hemoglobin as low as 5.3 for which she was instructed to come to the emergency department.  Patient reports that she has been feeling fatigued, lightheaded/dizzy, and anxious at times.  She had been off of any kind of iron supplementation, but had recently restarted taking a multivitamin with iron in the last week or so.  Denies any prior history of needing blood transfusions in the past.  Her periods are usually regular lasting approximately 5 days.  The second day of her menstrual cycle is usually heavy and she uses up to 8 super tampons in a day, but on all other days goes through 1 or 2.  Denies having any recent loss of consciousness, trauma, headache, focal weakness, nausea, vomiting, diarrhea, or blood in stools to her knowledge.  Patient does admit to vaping, but denies any routine use of alcohol or illicit drugs.  She does not have a OB/GYN and usually receives all of her care from her primary care provider.   Hospital Course by Problem:   Microcytic anemia: Acute.  Patient presents with hemoglobin 5.3 with low MCV and MCH.  Stool guaiacs negative.  Suspect secondary to heavy menstrual periods.  -Admit to a medical telemetry -Anemia panel was significant for signs of iron deficiency -Continue with transfusion of 2 units of packed red blood cells -Feraheme ordered for in a.m. -Recommended patient to take 325 mg of ferrous sulfate daily with vitamin C 500 mg to help decrease GI discomfort at home -Continue to monitor and transfuse blood products as needed  Suspected Menorrhagia:  -Patient notes have regular menstrual periods, but heavy bleeding on some days. -OB/GYN recommending Provera 10 mg to start with her next period -Outpatient follow-up to be arranged by OB/GYN    Medical Consultants:   GYN  Discharge Exam:   Vitals:   04/23/19 0517 04/23/19 0727  BP: 123/75 105/64  Pulse: 86 81  Resp: 16 18  Temp: 98 F (36.7 C) 98.4 F (36.9 C)  SpO2: 100% 100%   Vitals:   04/22/19 2144 04/23/19 0004 04/23/19 0517 04/23/19 0727  BP: 136/76 130/70 123/75 105/64  Pulse: 97 85 86 81  Resp:   16 18  Temp: 98.2 F (36.8 C) 98.3 F (36.8 C) 98 F (36.7 C) 98.4 F (36.9 C)  TempSrc: Oral Oral Oral Oral  SpO2: 100% 100% 100% 100%  Weight:      Height:        General exam: Appears calm and comfortable.  The results of significant diagnostics from this hospitalization (including imaging, microbiology, ancillary and laboratory) are listed below for reference.     Procedures and Diagnostic Studies:   US Pelvic Complete With Transvaginal  Result Date: 04/22/2019 CLINICAL DATA:  38 year old female with abnormal uterine bleeding. LMP: 04/14/2019 EXAM: TRANSABDOMINAL AND TRANSVAGINAL ULTRASOUND OF PELVIS DOPPLER ULTRASOUND OF OVARIES TECHNIQUE: Both transabdominal and transvaginal ultrasound examinations of the pelvis were performed. Transabdominal technique was performed for global imaging of the pelvis including uterus, ovaries, adnexal regions, and pelvic cul-de-sac. It was necessary to proceed with endovaginal exam following the transabdominal exam to visualize the  endometrium and ovaries. Color and duplex Doppler ultrasound was utilized to evaluate blood flow to the ovaries. COMPARISON:  None. FINDINGS: Uterus Measurements: 10.7 x 4.8 x 6.2 cm = volume: 165 mL. The uterus is anteverted and appears unremarkable. Endometrium Thickness: 13 mm. There is a 1.9 x 1.4 cm echogenic lesion in the fundal endometrium concerning for a polyp. Further evaluation with saline infused hysterosonography or hysteroscopy is recommended. Right ovary Measurements: 3.2 x 1.8 x 2.1 cm = volume: 6.1 mL. Normal appearance/no adnexal mass. Left ovary Measurements: 4.0 x 2.5 x 2.9 cm = volume: 15 mL. Dominant cyst or corpus luteum noted. Doppler images demonstrate arterial and venous flow to the right ovary. Doppler interrogation of the left ovary was not performed. IMPRESSION: 1. Echogenic lesion in the fundal endometrium concerning for a polyp. Further evaluation with hysteroscopy or saline infused hysterosonography recommended. 2. Unremarkable ovaries. Electronically Signed   By: Elgie CollardArash  Radparvar M.D.   On: 04/22/2019 17:52     Labs:   Basic Metabolic Panel: Recent Labs  Lab 04/22/19 0944  NA 138  K 3.8  CL 106  CO2 23  GLUCOSE 113*  BUN 5*  CREATININE 0.68  CALCIUM 9.1   GFR Estimated Creatinine Clearance: 86.2 mL/min (by C-G formula based on SCr of 0.68 mg/dL). Liver Function Tests: Recent Labs  Lab 04/22/19 0944  AST 19  ALT 13  ALKPHOS 50  BILITOT 0.4  PROT 6.7  ALBUMIN 4.1   No results for input(s): LIPASE, AMYLASE in the last 168 hours. No results for input(s): AMMONIA in the last 168 hours. Coagulation profile No results for input(s): INR, PROTIME in the last 168 hours.  CBC: Recent Labs  Lab 04/22/19 0944 04/23/19 0439  WBC 5.0 6.5  NEUTROABS  --  PENDING  HGB 5.7* 8.4*  HCT 22.9* 29.6*  MCV 63.1* 69.2*  PLT 358 283   Cardiac Enzymes: No results for input(s): CKTOTAL, CKMB, CKMBINDEX, TROPONINI in the last 168 hours. BNP: Invalid input(s):  POCBNP CBG: No results for input(s): GLUCAP in the last 168 hours. D-Dimer No results for input(s): DDIMER in the last 72 hours. Hgb A1c No results for input(s): HGBA1C in the last 72 hours. Lipid Profile No results for input(s): CHOL, HDL, LDLCALC, TRIG, CHOLHDL, LDLDIRECT in the last 72 hours. Thyroid function studies Recent Labs    04/23/19 0439  TSH 1.832   Anemia work up Recent Labs    04/22/19 1235  VITAMINB12 494  FOLATE 30.3  FERRITIN 2*  TIBC 531*  IRON 7*  RETICCTPCT 1.3   Microbiology Recent Results (from the past 240 hour(s))  SARS CORONAVIRUS 2 (TAT 6-24 HRS) Nasopharyngeal Nasopharyngeal Swab     Status: None   Collection Time: 04/22/19  1:40 PM   Specimen: Nasopharyngeal Swab  Result Value Ref Range Status   SARS Coronavirus 2 NEGATIVE NEGATIVE Final    Comment: (NOTE) SARS-CoV-2 target nucleic acids are NOT DETECTED. The SARS-CoV-2 RNA is generally detectable in upper and lower respiratory specimens during the acute phase of infection. Negative results do not preclude SARS-CoV-2 infection, do not rule out co-infections with other pathogens,  and should not be used as the sole basis for treatment or other patient management decisions. Negative results must be combined with clinical observations, patient history, and epidemiological information. The expected result is Negative. Fact Sheet for Patients: HairSlick.no Fact Sheet for Healthcare Providers: quierodirigir.com This test is not yet approved or cleared by the [place] FDA and  has been authorized for detection and/or diagnosis of SARS-CoV-2 by FDA under an Emergency Use Authorization (EUA). This EUA will remain  in effect (meaning this test can be used) for the duration of the COVID-19 declaration under Section 56 4(b)(1) of the Act, 21 U.S.C. section 360bbb-3(b)(1), unless the authorization is terminated or revoked sooner. Performed  at [facility], [address] [place], [place] [address]      Discharge Instructions:   Discharge Instructions    Diet general   Complete by: As directed    Discharge instructions   Complete by: As directed    Iron can be constipating, can take OTC supplements-- can also take with vitamin C for better absorption   Increase activity slowly   Complete by: As directed      Allergies as of 04/23/2019   No Known Allergies     Medication List    STOP taking these medications   ibuprofen 800 MG tablet Commonly known as: ADVIL   prenatal multivitamin Tabs tablet     TAKE these medications   Iron (Ferrous Sulfate) 325 (65 Fe) MG Tabs Take 325 mg by mouth daily.   medroxyPROGESTERone 10 MG tablet Commonly known as: Provera 10 mg daily to start with her next period      Follow-up Information    Center for [facility]. Call.   Specialty: Obstetrics and Gynecology Contact information: [address] [place] [address], [id number] [place] [address] [phone]           Time coordinating discharge: 25 min  Signed:  Joseph Art DO  [facility] 04/23/2019, 8:37 AM

## 2019-05-27 ENCOUNTER — Other Ambulatory Visit: Payer: Self-pay

## 2019-05-27 ENCOUNTER — Encounter: Payer: Self-pay | Admitting: Obstetrics and Gynecology

## 2019-05-27 ENCOUNTER — Ambulatory Visit (INDEPENDENT_AMBULATORY_CARE_PROVIDER_SITE_OTHER): Payer: Medicaid Other | Admitting: Obstetrics and Gynecology

## 2019-05-27 VITALS — BP 141/89 | HR 100 | Wt 151.0 lb

## 2019-05-27 DIAGNOSIS — N939 Abnormal uterine and vaginal bleeding, unspecified: Secondary | ICD-10-CM

## 2019-05-27 MED ORDER — SLYND 4 MG PO TABS: 1.0000 | ORAL_TABLET | Freq: Every day | ORAL | 4 refills | Status: DC

## 2019-05-27 NOTE — Progress Notes (Signed)
38 yo P4 here for follow up on recent admission for symptomatic anemia due to AUB. Patient reports feeling well. She completed her course of provera. Patient reports a heavier period in September lasting 7 day. She states that her cycle are typically 4-5 days with the first day being heavy. She is not sexually active with her husband as she was hoping her would have a vasectomy. She denies pelvic pain or abnormal discharge   Past Medical History:  Diagnosis Date  . Anxiety    No past surgical history on file. Family History  Problem Relation Age of Onset  . Heart disease Mother   . Hyperlipidemia Mother    Social History   Tobacco Use  . Smoking status: Current Every Day Smoker    Types: E-cigarettes  . Smokeless tobacco: Never Used  Substance Use Topics  . Alcohol use: Yes    Alcohol/week: 12.0 standard drinks    Types: 12 Cans of beer per week  . Drug use: No   ROS See pertinent in HPI  Blood pressure (!) 141/89, pulse 100, weight 151 lb (68.5 kg), unknown if currently breastfeeding.   GENERAL: Well-developed, well-nourished female in no acute distress.  HEENT: Normocephalic, atraumatic. Sclerae anicteric.  NECK: Supple. Normal thyroid.  LUNGS: Clear to auscultation bilaterally.  HEART: Regular rate and rhythm. BREASTS: Symmetric in size. No palpable masses or lymphadenopathy, skin changes, or nipple drainage. ABDOMEN: Soft, nontender, nondistended. No organomegaly. PELVIC: Normal external female genitalia. Vagina is pink and rugated.  Normal discharge. Normal appearing cervix. Uterus is normal in size. No adnexal mass or tenderness. EXTREMITIES: No cyanosis, clubbing, or edema, 2+ distal pulses.  9/9 pelvic ultrasound FINDINGS: Uterus  Measurements: 10.7 x 4.8 x 6.2 cm = volume: 165 mL. The uterus is anteverted and appears unremarkable.  Endometrium  Thickness: 13 mm. There is a 1.9 x 1.4 cm echogenic lesion in the fundal endometrium concerning for a polyp.  Further evaluation with saline infused hysterosonography or hysteroscopy is recommended.  Right ovary  Measurements: 3.2 x 1.8 x 2.1 cm = volume: 6.1 mL. Normal appearance/no adnexal mass.  Left ovary  Measurements: 4.0 x 2.5 x 2.9 cm = volume: 15 mL. Dominant cyst or corpus luteum noted.  Doppler images demonstrate arterial and venous flow to the right ovary. Doppler interrogation of the left ovary was not performed.  IMPRESSION: 1. Echogenic lesion in the fundal endometrium concerning for a polyp. Further evaluation with hysteroscopy or saline infused hysterosonography recommended. 2. Unremarkable ovaries.   Electronically Signed   By: Anner Crete M.D.   On: 04/22/2019 17:52  A/P 38 yo with AUB and recent need for blood transfusion - Reviewed ultrasound findings with the patient - Discussed options of medical management with contraception, surgical management with D&C and/or endometrial ablation. Patient opted for medical management with birth control pills. Patient is self pay and plans to follow up with PCP for pap smear and flu on sliding scale fee - RTC prn

## 2019-09-07 ENCOUNTER — Other Ambulatory Visit: Payer: Self-pay | Admitting: Obstetrics and Gynecology

## 2019-09-07 MED ORDER — MEGESTROL ACETATE 40 MG PO TABS: 40.0000 mg | ORAL_TABLET | Freq: Two times a day (BID) | ORAL | 5 refills | Status: DC

## 2019-09-09 ENCOUNTER — Telehealth: Payer: Self-pay | Admitting: Lactation Services

## 2019-09-09 NOTE — Telephone Encounter (Signed)
Attempted to call pt to discuss with her what Dr. Jolayne Panther recommends. LM for pt to call the office to schedule to come in for CBC.

## 2019-09-10 ENCOUNTER — Other Ambulatory Visit: Payer: Medicaid Other

## 2019-09-10 ENCOUNTER — Other Ambulatory Visit: Payer: Self-pay

## 2019-09-10 ENCOUNTER — Other Ambulatory Visit (HOSPITAL_COMMUNITY): Payer: Self-pay | Admitting: Lactation Services

## 2019-09-10 ENCOUNTER — Other Ambulatory Visit: Payer: Self-pay | Admitting: Lactation Services

## 2019-09-10 DIAGNOSIS — N939 Abnormal uterine and vaginal bleeding, unspecified: Secondary | ICD-10-CM

## 2019-09-11 ENCOUNTER — Telehealth: Payer: Self-pay | Admitting: General Practice

## 2019-09-11 LAB — CBC
Hematocrit: 27.3 % — ABNORMAL LOW (ref 34.0–46.6)
Hemoglobin: 9.4 g/dL — ABNORMAL LOW (ref 11.1–15.9)
MCH: 29.9 pg (ref 26.6–33.0)
MCHC: 34.4 g/dL (ref 31.5–35.7)
MCV: 87 fL (ref 79–97)
Platelets: 369 10*3/uL (ref 150–450)
RBC: 3.14 x10E6/uL — ABNORMAL LOW (ref 3.77–5.28)
RDW: 14.3 % (ref 11.7–15.4)
WBC: 8.4 10*3/uL (ref 3.4–10.8)

## 2019-09-11 NOTE — Telephone Encounter (Signed)
Called patient and informed her of results and recommendations. Patient verbalized understanding and asked if she could have a repeat CBC in a month to see if there has been improvement. Told patient I would ask Dr Jolayne Panther and reach out to her via mychart. Patient verbalized understanding & had no questions.

## 2019-09-11 NOTE — Telephone Encounter (Signed)
-----   Message from Catalina Antigua, MD sent at 09/11/2019  9:10 AM EST ----- Please inform patient of blood test showing that she is anemic but not as anemic as she was in September. I would encourage her to continue taking her iron twice a day

## 2019-09-14 ENCOUNTER — Encounter: Payer: Self-pay | Admitting: General Practice

## 2020-02-09 ENCOUNTER — Encounter: Payer: Self-pay | Admitting: Obstetrics and Gynecology

## 2020-02-09 ENCOUNTER — Ambulatory Visit (INDEPENDENT_AMBULATORY_CARE_PROVIDER_SITE_OTHER): Payer: Medicaid Other | Admitting: Obstetrics and Gynecology

## 2020-02-09 ENCOUNTER — Other Ambulatory Visit: Payer: Self-pay

## 2020-02-09 ENCOUNTER — Other Ambulatory Visit (HOSPITAL_COMMUNITY)
Admission: RE | Admit: 2020-02-09 | Discharge: 2020-02-09 | Disposition: A | Discharge status: Home / Self Care | Payer: Medicaid Other | Source: Ambulatory Visit | Attending: Obstetrics and Gynecology | Admitting: Obstetrics and Gynecology

## 2020-02-09 VITALS — BP 160/92 | HR 117 | Ht 61.0 in | Wt 163.9 lb

## 2020-02-09 DIAGNOSIS — N939 Abnormal uterine and vaginal bleeding, unspecified: Secondary | ICD-10-CM | POA: Diagnosis not present

## 2020-02-09 DIAGNOSIS — Z3202 Encounter for pregnancy test, result negative: Secondary | ICD-10-CM | POA: Diagnosis not present

## 2020-02-09 DIAGNOSIS — Z30430 Encounter for insertion of intrauterine contraceptive device: Secondary | ICD-10-CM | POA: Diagnosis not present

## 2020-02-09 DIAGNOSIS — Z124 Encounter for screening for malignant neoplasm of cervix: Secondary | ICD-10-CM | POA: Diagnosis present

## 2020-02-09 DIAGNOSIS — Z01812 Encounter for preprocedural laboratory examination: Secondary | ICD-10-CM

## 2020-02-09 LAB — POCT PREGNANCY, URINE: Preg Test, Ur: NEGATIVE

## 2020-02-09 MED ORDER — LEVONORGESTREL 19.5 MCG/DAY IU IUD
INTRAUTERINE_SYSTEM | Freq: Once | INTRAUTERINE | Status: AC
  Administered 2020-02-09: 1 via INTRAUTERINE

## 2020-02-09 NOTE — Addendum Note (Signed)
Addended by: Faythe Casa on: 02/09/2020 12:04 PM   Modules accepted: Orders

## 2020-02-09 NOTE — Progress Notes (Signed)
39 yo here for pap smear and IUD for management of AUB. Patient was previously on POP without improvement in her bleeding. She had previously used Mirena IUD and reports light bleeding. She is interested in trying it again. Patient is without any other complaints. She denies pelvic pain or abnormal discharge.   Past Medical History:  Diagnosis Date  . Anxiety    History reviewed. No pertinent surgical history. Family History  Problem Relation Age of Onset  . Heart disease Mother   . Hyperlipidemia Mother    Social History   Tobacco Use  . Smoking status: Current Every Day Smoker    Types: E-cigarettes  . Smokeless tobacco: Never Used  Substance Use Topics  . Alcohol use: Yes    Alcohol/week: 12.0 standard drinks    Types: 12 Cans of beer per week  . Drug use: No   ROS See pertinent in HPI Blood pressure (!) 160/92, pulse (!) 117, height 5\' 1"  (1.549 m), weight 163 lb 14.4 oz (74.3 kg), last menstrual period 01/24/2020, unknown if currently breastfeeding. GENERAL: Well-developed, well-nourished female in no acute distress.  HEENT: Normocephalic, atraumatic. Sclerae anicteric.  NECK: Supple. Normal thyroid.  LUNGS: Clear to auscultation bilaterally.  HEART: Regular rate and rhythm. BREASTS: Symmetric in size. No palpable masses or lymphadenopathy, skin changes, or nipple drainage. ABDOMEN: Soft, nontender, nondistended. No organomegaly. PELVIC: Normal external female genitalia. Vagina is pink and rugated.  Normal discharge. Normal appearing cervix. Uterus is normal in size. No adnexal mass or tenderness. EXTREMITIES: No cyanosis, clubbing, or edema, 2+ distal pulses.  A/P 39 yo here for IUD insertion - Pap smear collected - IUD Procedure Note Patient identified, informed consent performed, signed copy in chart, time out was performed.  Urine pregnancy test negative.  Speculum placed in the vagina.  Cervix visualized.  Cleaned with Betadine x 2.  Grasped anteriorly with a  single tooth tenaculum.  Uterus sounded to 9 cm.  Liletta IUD placed per manufacturer's recommendations.  Strings trimmed to 3 cm. Tenaculum was removed, good hemostasis noted.  Patient tolerated procedure well.   Patient given post procedure instructions and Liletta care card with expiration date.  Patient is asked to check IUD strings periodically and follow up in 4-6 weeks for IUD check.

## 2020-02-11 LAB — CYTOLOGY - PAP
Comment: NEGATIVE
Diagnosis: NEGATIVE
High risk HPV: NEGATIVE

## 2020-10-13 DIAGNOSIS — F411 Generalized anxiety disorder: Secondary | ICD-10-CM | POA: Diagnosis not present

## 2020-11-02 ENCOUNTER — Telehealth: Payer: Self-pay | Admitting: *Deleted

## 2020-11-02 NOTE — Telephone Encounter (Addendum)
Called pt following notification from "Team nurse" that pt reported her IUD fell out. Pt gave additional details as follows: She started a period on 3/21 which was very heavy. She denies pain. Today @ 10:30 she had a very full pad with clots that overflowed to her panties and when she wiped, she noticed the IUD on the tissue. Since that time, the bleeding has slowed down a lot. Pt states that the IUD was placed 02/09/20 in effort to reduce the amount of bleeding she experiences with her periods however they have still been very heavy. She requests appt in office to discuss management of heavy periods. Pt states that she does not need birth control between now and time of next appt. I advised that she will be notified of appt date and time via Mychart. Pt voiced understanding.

## 2020-11-09 ENCOUNTER — Other Ambulatory Visit: Payer: Self-pay

## 2020-11-09 ENCOUNTER — Encounter: Payer: Self-pay | Admitting: Obstetrics and Gynecology

## 2020-11-09 ENCOUNTER — Ambulatory Visit (INDEPENDENT_AMBULATORY_CARE_PROVIDER_SITE_OTHER): Payer: Medicaid Other | Admitting: Obstetrics and Gynecology

## 2020-11-09 VITALS — BP 159/100 | HR 98 | Ht 61.0 in | Wt 166.2 lb

## 2020-11-09 DIAGNOSIS — N939 Abnormal uterine and vaginal bleeding, unspecified: Secondary | ICD-10-CM

## 2020-11-09 DIAGNOSIS — T8332XA Displacement of intrauterine contraceptive device, initial encounter: Secondary | ICD-10-CM

## 2020-11-09 MED ORDER — TRANEXAMIC ACID 650 MG PO TABS: 1300.0000 mg | ORAL_TABLET | Freq: Three times a day (TID) | ORAL | 2 refills | Status: DC

## 2020-11-09 NOTE — Progress Notes (Signed)
   GYNECOLOGY OFFICE NOTE  History:  40 y.o. [id number] here today for follow up for IUD falling out. Had IUD placed 01/2020, continued to have very heavy periods. Last Monday, she started having her period and it was heavy as usual. On Tuesday, her period was extremely heavy and came out with a large clot, then period stopped essentially that night. She reports it was the entire IUD with strings and arms, etc. She had IUD placed for abnormal uterine bleeding.    Past Medical History:  Diagnosis Date  . Anxiety    History reviewed. No pertinent surgical history.   Current Outpatient Medications:  .  citalopram (CELEXA) 10 MG tablet, Take 10 mg by mouth daily., Disp: , Rfl:  .  Iron, Ferrous Sulfate, 325 (65 Fe) MG TABS, Take 325 mg by mouth daily., Disp: 30 tablet, Rfl: 1 .  tranexamic acid (LYSTEDA) 650 MG TABS tablet, Take 2 tablets (1,300 mg total) by mouth 3 (three) times daily. Take during menses for a maximum of five days, Disp: 30 tablet, Rfl: 2  The following portions of the patient's history were reviewed and updated as appropriate: allergies, current medications, past family history, past medical history, past social history, past surgical history and problem list.   Review of Systems:  Pertinent items noted in HPI and remainder of comprehensive ROS otherwise negative.   Objective:  Physical Exam BP (!) 159/100   Pulse 98   Ht 5\' 1"  (1.549 m)   Wt 166 lb 3.2 oz (75.4 kg)   LMP 10/31/2020 (Exact Date)   Breastfeeding No   BMI 31.40 kg/m  CONSTITUTIONAL: Well-developed, well-nourished female in no acute distress.  HENT:  Normocephalic, atraumatic. External right and left ear normal. Oropharynx is clear and moist EYES: Conjunctivae and EOM are normal. Pupils are equal, round, and reactive to light. No scleral icterus.  NECK: Normal range of motion, supple, no masses SKIN: Skin is warm and dry. No rash noted. Not diaphoretic. No erythema. No pallor. NEUROLOGIC: Alert and  oriented to person, place, and time. Normal reflexes, muscle tone coordination. No cranial nerve deficit noted. PSYCHIATRIC: Normal mood and affect. Normal behavior. Normal judgment and thought content. CARDIOVASCULAR: Normal heart rate noted RESPIRATORY: Effort normal, no problems with respiration noted ABDOMEN: Soft, no distention noted.   PELVIC: deferred MUSCULOSKELETAL: Normal range of motion. No edema noted.  Labs and Imaging No results found.  Assessment & Plan:   1. Intrauterine device (IUD) migration, initial encounter - Patient reports it came out entirely - Does not want it replaced  2. Abnormal uterine bleeding (AUB) - Bleeding not improved with IUD (which is now out) - Not a candidate for OCP/nuvaring due to HTN and smoking - reviewed contraceptive and non-contraceptive options for controlling AUB - will start lysteda and see if this improves - return 3 months   Routine preventative health maintenance measures emphasized. Please refer to After Visit Summary for other counseling recommendations.   Return in about 3 months (around 02/09/2021) for Followup.   02/11/2021, MD, [facility] Attending Center for [facility])

## 2021-04-23 DIAGNOSIS — F29 Unspecified psychosis not due to a substance or known physiological condition: Secondary | ICD-10-CM | POA: Diagnosis not present

## 2021-04-23 DIAGNOSIS — I499 Cardiac arrhythmia, unspecified: Secondary | ICD-10-CM | POA: Diagnosis not present

## 2021-04-30 NOTE — Progress Notes (Signed)
Subjective:    Emily Small - 40 y.o. female MRN [id number]  Date of birth: [birth year]  HPI  Emily Small is to establish care. She is accompanied by her husband.   Current issues and/or concerns: Reports history of anemia and taking iron supplements. Reports would rather wait to recheck CBC with her annual physical exam on her next visit.   Reports anxiety mostly related to possible health concerns or having cancer. Anxiety sometimes causing heart palpitations, headaches, and dizziness. Reports taking Celexa 10 mg daily for anxiety. Does not feel that current dose is working. Reports over the course of the last several months Celexa dosages were decreased to her current 10 mg dose by previous primary care provider. Anxiety usually pronounced 1 week before periods. Reports anxiety awakening her during night. Endorses panic attacks beginning at 40 years old. Reports she is usually able to talk herself through panic attacks and has good emotional support from her husband. Declines referral to Psychiatry.   Reports blood pressures are always higher in the doctor's office related to increased anxiety. Reports when checking at home blood pressures are much lower.   Depression screen [facility] 2/9 05/05/2021 11/09/2020 02/09/2020 01/17/2016 03/03/2015  Decreased Interest 0 0 0 0 0  Down, Depressed, Hopeless 0 0 0 0 0  PHQ - 2 Score 0 0 0 0 0  Altered sleeping - 1 1 - -  Tired, decreased energy - 0 0 - -  Change in appetite - 0 0 - -  Feeling bad or failure about yourself  - 0 0 - -  Trouble concentrating - 0 0 - -  Moving slowly or fidgety/restless - 0 0 - -  Suicidal thoughts - 0 0 - -  PHQ-9 Score - 1 1 - -    ROS per HPI   Health Maintenance: Health Maintenance Due  Topic Date Due   HEMOGLOBIN A1C  Never done   COVID-19 Vaccine (1) Never done   FOOT EXAM  Never done   OPHTHALMOLOGY EXAM  Never done   URINE MICROALBUMIN  Never done   Hepatitis C Screening  Never done   INFLUENZA VACCINE   Never done     Past Medical History: Patient Active Problem List   Diagnosis Date Noted   Microcytic anemia 04/22/2019   Menorrhagia 04/22/2019   Normal labor and delivery 03/21/2016   NSVD (normal spontaneous vaginal delivery) 03/21/2016      Social History   reports that she has quit smoking. Her smoking use included e-cigarettes. She has never used smokeless tobacco. She reports current alcohol use of about 12.0 standard drinks per week. She reports that she does not use drugs.   Family History  family history includes Diabetes in her father; Heart disease in her father and mother; Hyperlipidemia in her mother; Hypertension in her father.   Medications: reviewed and updated   Objective:   Physical Exam BP (!) 173/105 (BP Location: Left Arm, Patient Position: Sitting, Cuff Size: Large)   Pulse (!) 120   Temp 98.1 F (36.7 C)   Resp 16   Ht 5' 0.98" (1.549 m)   Wt 156 lb 9.6 oz (71 kg)   SpO2 98%   BMI 29.60 kg/m    Physical Exam HENT:     Head: Normocephalic and atraumatic.  Eyes:     Extraocular Movements: Extraocular movements intact.     Conjunctiva/sclera: Conjunctivae normal.     Pupils: Pupils are equal, round, and reactive to light.  Cardiovascular:     Rate and Rhythm: Tachycardia present.     Pulses: Normal pulses.     Heart sounds: Normal heart sounds.  Pulmonary:     Effort: Pulmonary effort is normal.     Breath sounds: Normal breath sounds.  Musculoskeletal:     Cervical back: Normal range of motion and neck supple.  Neurological:     General: No focal deficit present.     Mental Status: She is alert and oriented to person, place, and time.  Psychiatric:        Mood and Affect: Mood normal.        Behavior: Behavior normal.    Assessment & Plan:  1. Encounter to establish care: - Patient presents today to establish care.  - Return for annual physical examination, labs, and health maintenance. Arrive fasting meaning having no food for at  least 8 hours prior to appointment. You may have only water or black coffee. Please take scheduled medications as normal.  2. Generalized anxiety disorder: - Patient denies thoughts of self-harm, suicidal ideations, and homicidal ideations.  - Increase Citalopram from 10 mg daily to 20 mg daily.  Do not drink alcohol or use illicit substances with with this medication.  Avoid driving or hazardous activity until you know how this medication will affect you. Your reactions could be impaired. Dizziness or fainting can cause falls, accidents, or severe injuries. Common side effects include drowsiness, nausea, constipation, loss of appetite, dry mouth, increased sweating. Call your provider if you have pounding heartbeats or fluttering in your chest, a light-headed feeling like you may pass out, easy bruising/unusal bleeding, vision change, difficult or painful urination, impotence/sexual problems, liver problems (right-sided upper stomach pain, itching, dark urine, yellowing of skin or eyes/jaundice, low levels of sodium in the body (headache, confusion, slurred speech, severe weakness, vomiting, loss of coordination, feeling unsteady), or manic episodes (racing thoughts, increased energy, decreased need for sleep, risk-taking behavior, being agitated, talkative) Seek medical attention immediately if you have symptoms of serotonin syndrome such as agitation, hallucinations, fever, sweating, shivering, fast heart rate, muscle stiffness, twitching, loss of coordination, nausea, vomiting, or diarrhea Report any new or worsening symptoms to your provider, such as but not limited to: mood or behavior changes, anxiety, panic attacks, trouble sleeping, or if you feel impulsive, irritable, agitated, hostile, aggressive, restless, hyperactive (mentally or physically), more depressed, or have thoughts about suicide or hurting yourself - Patient declined referral to Psychiatry.  - Follow-up with primary provider in 4  weeks or sooner if needed.  - citalopram (CELEXA) 20 MG tablet; Take 1 tablet (20 mg total) by mouth daily.  Dispense: 30 tablet; Refill: 0  3. Elevated blood pressure reading in office with white coat syndrome, without diagnosis of hypertension: - Blood pressure elevated during today's visit. Patient asymptomatic without chest pressure, chest pain, palpitations, shortness of breath, worst headache of life, and any additional red flag symptoms. - Follow-up with primary provider in 2 weeks or sooner if needed for blood pressure check.      Patient was given clear instructions to go to Emergency Department or return to medical center if symptoms don't improve, worsen, or new problems develop.The patient verbalized understanding.  I discussed the assessment and treatment plan with the patient. The patient was provided an opportunity to ask questions and all were answered. The patient agreed with the plan and demonstrated an understanding of the instructions.   The patient was advised to call back or seek an in-person  evaluation if the symptoms worsen or if the condition fails to improve as anticipated.    Ricky Stabs, NP 05/05/2021, 1:17 PM Primary Care at [facility]

## 2021-05-05 ENCOUNTER — Encounter: Payer: Self-pay | Admitting: Family

## 2021-05-05 ENCOUNTER — Emergency Department (HOSPITAL_COMMUNITY)
Admission: EM | Admit: 2021-05-05 | Discharge: 2021-05-05 | Disposition: A | Discharge status: Home / Self Care | Payer: Medicaid Other | Attending: Emergency Medicine | Admitting: Emergency Medicine

## 2021-05-05 ENCOUNTER — Other Ambulatory Visit: Payer: Self-pay

## 2021-05-05 ENCOUNTER — Other Ambulatory Visit: Payer: Self-pay | Admitting: Family

## 2021-05-05 ENCOUNTER — Ambulatory Visit (INDEPENDENT_AMBULATORY_CARE_PROVIDER_SITE_OTHER): Payer: Medicaid Other | Admitting: Family

## 2021-05-05 ENCOUNTER — Telehealth: Payer: Self-pay | Admitting: Family

## 2021-05-05 ENCOUNTER — Emergency Department (HOSPITAL_COMMUNITY): Payer: Medicaid Other

## 2021-05-05 ENCOUNTER — Ambulatory Visit
Admission: EM | Admit: 2021-05-05 | Discharge: 2021-05-05 | Disposition: A | Discharge status: Other Acute Inpatient Hospital | Payer: Medicaid Other | Attending: Internal Medicine | Admitting: Internal Medicine

## 2021-05-05 VITALS — BP 173/105 | HR 120 | Temp 98.1°F | Resp 16 | Ht 60.98 in | Wt 156.6 lb

## 2021-05-05 DIAGNOSIS — Z7689 Persons encountering health services in other specified circumstances: Secondary | ICD-10-CM | POA: Diagnosis not present

## 2021-05-05 DIAGNOSIS — R030 Elevated blood-pressure reading, without diagnosis of hypertension: Secondary | ICD-10-CM

## 2021-05-05 DIAGNOSIS — Z79899 Other long term (current) drug therapy: Secondary | ICD-10-CM | POA: Diagnosis not present

## 2021-05-05 DIAGNOSIS — F411 Generalized anxiety disorder: Secondary | ICD-10-CM

## 2021-05-05 DIAGNOSIS — R000 Tachycardia, unspecified: Secondary | ICD-10-CM | POA: Insufficient documentation

## 2021-05-05 DIAGNOSIS — U071 COVID-19: Secondary | ICD-10-CM | POA: Insufficient documentation

## 2021-05-05 DIAGNOSIS — I10 Essential (primary) hypertension: Secondary | ICD-10-CM | POA: Diagnosis not present

## 2021-05-05 DIAGNOSIS — Z8249 Family history of ischemic heart disease and other diseases of the circulatory system: Secondary | ICD-10-CM | POA: Insufficient documentation

## 2021-05-05 DIAGNOSIS — Z7901 Long term (current) use of anticoagulants: Secondary | ICD-10-CM | POA: Diagnosis not present

## 2021-05-05 DIAGNOSIS — R457 State of emotional shock and stress, unspecified: Secondary | ICD-10-CM | POA: Diagnosis not present

## 2021-05-05 DIAGNOSIS — Z87891 Personal history of nicotine dependence: Secondary | ICD-10-CM | POA: Insufficient documentation

## 2021-05-05 DIAGNOSIS — R059 Cough, unspecified: Secondary | ICD-10-CM | POA: Diagnosis not present

## 2021-05-05 LAB — CBC WITH DIFFERENTIAL/PLATELET
Abs Immature Granulocytes: 0.02 10*3/uL (ref 0.00–0.07)
Basophils Absolute: 0.1 10*3/uL (ref 0.0–0.1)
Basophils Relative: 1 %
Eosinophils Absolute: 0 10*3/uL (ref 0.0–0.5)
Eosinophils Relative: 0 %
HCT: 41.4 % (ref 36.0–46.0)
Hemoglobin: 13 g/dL (ref 12.0–15.0)
Immature Granulocytes: 0 %
Lymphocytes Relative: 11 %
Lymphs Abs: 0.9 10*3/uL (ref 0.7–4.0)
MCH: 28 pg (ref 26.0–34.0)
MCHC: 31.4 g/dL (ref 30.0–36.0)
MCV: 89 fL (ref 80.0–100.0)
Monocytes Absolute: 0.7 10*3/uL (ref 0.1–1.0)
Monocytes Relative: 8 %
Neutro Abs: 6.6 10*3/uL (ref 1.7–7.7)
Neutrophils Relative %: 80 %
Platelets: 330 10*3/uL (ref 150–400)
RBC: 4.65 MIL/uL (ref 3.87–5.11)
RDW: 13.7 % (ref 11.5–15.5)
WBC: 8.2 10*3/uL (ref 4.0–10.5)
nRBC: 0 % (ref 0.0–0.2)

## 2021-05-05 LAB — RESP PANEL BY RT-PCR (FLU A&B, COVID) ARPGX2
Influenza A by PCR: NEGATIVE
Influenza B by PCR: NEGATIVE
SARS Coronavirus 2 by RT PCR: POSITIVE — AB

## 2021-05-05 LAB — URINALYSIS, ROUTINE W REFLEX MICROSCOPIC
Bilirubin Urine: NEGATIVE
Glucose, UA: NEGATIVE mg/dL
Hgb urine dipstick: NEGATIVE
Ketones, ur: NEGATIVE mg/dL
Leukocytes,Ua: NEGATIVE
Nitrite: NEGATIVE
Protein, ur: NEGATIVE mg/dL
Specific Gravity, Urine: <1.005 — ABNORMAL LOW (ref 1.005–1.030)
pH: 6 (ref 5.0–8.0)

## 2021-05-05 LAB — BASIC METABOLIC PANEL
Anion gap: 12 (ref 5–15)
BUN: 8 mg/dL (ref 6–20)
CO2: 23 mmol/L (ref 22–32)
Calcium: 9.3 mg/dL (ref 8.9–10.3)
Chloride: 103 mmol/L (ref 98–111)
Creatinine, Ser: 0.71 mg/dL (ref 0.44–1.00)
GFR, Estimated: >60 mL/min (ref >60)
Glucose, Bld: 112 mg/dL — ABNORMAL HIGH (ref 70–99)
Potassium: 3.5 mmol/L (ref 3.5–5.1)
Sodium: 138 mmol/L (ref 135–145)

## 2021-05-05 LAB — HCG, QUANTITATIVE, PREGNANCY: hCG, Beta Chain, Quant, S: <1 m[IU]/mL (ref <5)

## 2021-05-05 LAB — GROUP A STREP BY PCR: Group A Strep by PCR: NOT DETECTED

## 2021-05-05 MED ORDER — AMLODIPINE BESYLATE 5 MG PO TABS: 5.0000 mg | ORAL_TABLET | Freq: Every day | ORAL | 0 refills | Status: DC

## 2021-05-05 MED ORDER — CITALOPRAM HYDROBROMIDE 20 MG PO TABS: 20.0000 mg | ORAL_TABLET | Freq: Every day | ORAL | 0 refills | Status: DC

## 2021-05-05 MED ORDER — LORAZEPAM 0.5 MG PO TABS
0.5000 mg | ORAL_TABLET | Freq: Once | ORAL | Status: AC
  Administered 2021-05-05: 0.5 mg via ORAL
  Filled 2021-05-05: qty 1

## 2021-05-05 MED ORDER — SODIUM CHLORIDE 0.9 % IV BOLUS
500.0000 mL | Freq: Once | INTRAVENOUS | Status: AC
  Administered 2021-05-05: 500 mL via INTRAVENOUS

## 2021-05-05 NOTE — ED Triage Notes (Signed)
Pt states that she has a sore throat and cough starting two days ago.

## 2021-05-05 NOTE — ED Triage Notes (Signed)
Pt came in via EMS with c/o HTN. Pt went to PCP this morning. BP was 170s/100. Pt was prescribed Norvasc. Pt went to UC because it was she was taking her BP at home and it was still high. UC pressure was 190s/120s. Pt current BP 210/117. HR 139. Pt also c/o intermittent headache in front.

## 2021-05-05 NOTE — Progress Notes (Signed)
Pt presents to establish care, pt reports low RBC couple years ago wanted to have CBC completed

## 2021-05-05 NOTE — ED Provider Notes (Signed)
[facility]-EMERGENCY DEPT Provider Note   CSN: [id number] Arrival date & time: 05/05/21  2009     History Chief Complaint  Patient presents with   Hypertension    Emily Small is a 40 y.o. female.  She has a history of hypertension and anxiety.  Usually blood pressures are Emily Small 130/90 when she checks it at home but are always more elevated when she goes to the doctor.  She went to a new doctor today and found Emily Small blood pressure to be elevated.  They started Emily Small on Norvasc and she just took 5 mg few hours ago.  She continues to have elevated heart rate and blood pressure.  This happened once before a few years ago when she was found to be severely anemic.  She is also had a couple of days of cough sore throat headache and back pain.  Similar to when she was diagnosed with COVID.  She took a home COVID test that was negative.  The history is provided by the patient and the spouse.  Hypertension This is a chronic problem. The problem occurs constantly. The problem has not changed since onset.Associated symptoms include headaches. Pertinent negatives include no chest pain, no abdominal pain and no shortness of breath. Nothing aggravates the symptoms. Nothing relieves the symptoms. She has tried rest for the symptoms. The treatment provided no relief.      Past Medical History:  Diagnosis Date   Anxiety     Patient Active Problem List   Diagnosis Date Noted   Essential hypertension 05/05/2021   Microcytic anemia 04/22/2019   Menorrhagia 04/22/2019   Normal labor and delivery 03/21/2016   NSVD (normal spontaneous vaginal delivery) 03/21/2016    No past surgical history on file.   OB History     Gravida  4   Para  4   Term  4   Preterm  0   AB  0   Living  4      SAB  0   IAB  0   Ectopic  0   Multiple  0   Live Births  4           Family History  Problem Relation Age of Onset   Heart disease Mother    Hyperlipidemia Mother     Hypertension Father    Diabetes Father    Heart disease Father     Social History   Tobacco Use   Smoking status: Former    Types: E-cigarettes   Smokeless tobacco: Never  Vaping Use   Vaping Use: Never used  Substance Use Topics   Alcohol use: Yes    Alcohol/week: 12.0 standard drinks    Types: 12 Cans of beer per week   Drug use: No    Home Medications Prior to Admission medications   Medication Sig Start Date End Date Taking? Authorizing Provider  amLODipine (NORVASC) 5 MG tablet Take 1 tablet (5 mg total) by mouth daily. 05/05/21 06/04/21  Rema Fendt, NP  citalopram (CELEXA) 20 MG tablet Take 1 tablet (20 mg total) by mouth daily. 05/05/21   Rema Fendt, NP  Iron, Ferrous Sulfate, 325 (65 Fe) MG TABS Take 325 mg by mouth daily. 04/23/19   Joseph Art, DO  tranexamic acid (LYSTEDA) 650 MG TABS tablet Take 2 tablets (1,300 mg total) by mouth 3 (three) times daily. Take during menses for a maximum of five days Patient not taking: Reported on 05/05/2021 11/09/20  Conan Bowens, MD    Allergies    Patient has no known allergies.  Review of Systems   Review of Systems  Constitutional:  Negative for fever.  HENT:  Positive for sore throat.   Eyes:  Negative for visual disturbance.  Respiratory:  Positive for cough. Negative for shortness of breath.   Cardiovascular:  Positive for palpitations. Negative for chest pain.  Gastrointestinal:  Negative for abdominal pain.  Genitourinary:  Negative for dysuria.  Musculoskeletal:  Positive for back pain.  Skin:  Negative for rash.  Neurological:  Positive for headaches.   Physical Exam Updated Vital Signs BP (!) 183/103   Pulse (!) 129   Temp 99.6 F (37.6 C)   Resp 20   Ht 5\' 1"  (1.549 m)   Wt 71 kg   SpO2 99%   BMI 29.58 kg/m   Physical Exam Vitals and nursing note reviewed.  Constitutional:      General: She is not in acute distress.    Appearance: Normal appearance. She is well-developed.  HENT:      Head: Normocephalic and atraumatic.  Eyes:     Conjunctiva/sclera: Conjunctivae normal.  Cardiovascular:     Rate and Rhythm: Regular rhythm. Tachycardia present.     Pulses: Normal pulses.     Heart sounds: No murmur heard. Pulmonary:     Effort: Pulmonary effort is normal. No respiratory distress.     Breath sounds: Normal breath sounds.  Abdominal:     Palpations: Abdomen is soft.     Tenderness: There is no abdominal tenderness. There is no guarding or rebound.  Musculoskeletal:        General: No deformity or signs of injury. Normal range of motion.     Cervical back: Neck supple.  Skin:    General: Skin is warm and dry.  Neurological:     General: No focal deficit present.     Mental Status: She is alert.     Cranial Nerves: No cranial nerve deficit.     Sensory: No sensory deficit.     Motor: No weakness.    ED Results / Procedures / Treatments   Labs (all labs ordered are listed, but only abnormal results are displayed) Labs Reviewed  RESP PANEL BY RT-PCR (FLU A&B, COVID) ARPGX2 - Abnormal; Notable for the following components:      Result Value   SARS Coronavirus 2 by RT PCR POSITIVE (*)    All other components within normal limits  BASIC METABOLIC PANEL - Abnormal; Notable for the following components:   Glucose, Bld 112 (*)    All other components within normal limits  URINALYSIS, ROUTINE W REFLEX MICROSCOPIC - Abnormal; Notable for the following components:   Specific Gravity, Urine <1.005 (*)    All other components within normal limits  GROUP A STREP BY PCR  CBC WITH DIFFERENTIAL/PLATELET  HCG, QUANTITATIVE, PREGNANCY  TSH    EKG EKG Interpretation  Date/Time:  Friday May 05 2021 20:38:44 EDT Ventricular Rate:  123 PR Interval:  142 QRS Duration: 84 QT Interval:  311 QTC Calculation: 445 R Axis:   47 Text Interpretation: Sinus tachycardia Probable left atrial enlargement Probable inferior infarct, old No significant change since last tracing  Confirmed by [id number] [phone]) on 05/05/2021 8:45:32 PM  Radiology DG Chest Port 1 View  Result Date: 05/05/2021 CLINICAL DATA:  Cough and hypertension EXAM: PORTABLE CHEST 1 VIEW COMPARISON:  None. FINDINGS: The heart size and mediastinal contours are within normal limits.  Both lungs are clear. The visualized skeletal structures are unremarkable. IMPRESSION: No active disease. Electronically Signed   By: Jasmine Pang M.D.   On: 05/05/2021 21:21    Procedures Procedures   Medications Ordered in ED Medications  sodium chloride 0.9 % bolus 500 mL (0 mLs Intravenous Stopped 05/05/21 2322)  LORazepam (ATIVAN) tablet 0.5 mg (0.5 mg Oral Given 05/05/21 2353)    ED Course  I have reviewed the triage vital signs and the nursing notes.  Pertinent labs & imaging results that were available during my care of the patient were reviewed by me and considered in my medical decision making (see chart for details).  Clinical Course as of 05/06/21 1025  Fri May 05, 2021  2254 Emily Small lab work-up came back fairly unremarkable.  Have added on TSH.  Unfortunately patient's COVID test came back positive.  When I told Emily Small that Emily Small heart rate shot back up to 140.  She is otherwise very well-appearing so I do not think she needs admission.  Sats have been 99 to 100% on room air. [MB]    Clinical Course User Index [MB] Terrilee Files, MD   MDM Rules/Calculators/A&P                          MARCHE HOTTENSTEIN was evaluated in Emergency Department on 05/05/2021 for the symptoms described in the history of present illness. She was evaluated in the context of the global COVID-19 pandemic, which necessitated consideration that the patient might be at risk for infection with the SARS-CoV-2 virus that causes COVID-19. Institutional protocols and algorithms that pertain to the evaluation of patients at risk for COVID-19 are in a state of rapid change based on information released by regulatory bodies including the CDC and  federal and state organizations. These policies and algorithms were followed during the patient's care in the ED.  This patient complains of elevated heart rate elevated blood pressure cough and body aches; this involves an extensive number of treatment Options and is a complaint that carries with it a high risk of complications and Morbidity. The differential includes primary hypertension, anxiety,, anemia, COVID, dehydration  I ordered, reviewed and interpreted labs, which included CBC with normal white count normal hemoglobin, chemistries normal, urinalysis for dehydration, pregnancy test negative, strep negative, flu negative, COVID positive, TSH normal I ordered medication IV fluids I ordered imaging studies which included chest x-ray and I independently    visualized and interpreted imaging which showed no acute findings Additional history obtained from patient's spouse Previous records obtained and reviewed in epic including prior urgent care and primary care visits today  After the interventions stated above, I reevaluated the patient and found patient be awake and alert in no distress.  Remains tachycardic and hypertensive.  No evidence of endorgan damage.  I feel this is resulting from Emily Small acute illness with COVID along with some anxiety and white coat hypertension.  No indications for more aggressive treatment of blood pressure as no evidence of endorgan damage.  Recommended close follow-up with PCP.  Return instructions discussed   Final Clinical Impression(s) / ED Diagnoses Final diagnoses:  COVID-19 virus infection  Hypertension, unspecified type  Sinus tachycardia    Rx / DC Orders ED Discharge Orders     None        Terrilee Files, MD 05/06/21 1028

## 2021-05-05 NOTE — Patient Instructions (Signed)
Thank you for choosing Primary Care at [facility] for your medical home!    Emily Small was seen by Rema Fendt, NP today.   Emily Small's primary care provider is Rema Fendt, NP.   For the best care possible,  you should try to see Ricky Stabs, NP whenever you come to clinic.   We look forward to seeing you again soon!  If you have any questions about your visit today,  please call us at [phone]  Or feel free to reach your provider via MyChart.    Keeping you healthy   Get these tests Blood pressure- Have your blood pressure checked once a year by your healthcare provider.  Normal blood pressure is 120/80. Weight- Have your body mass index (BMI) calculated to screen for obesity.  BMI is a measure of body fat based on height and weight. You can also calculate your own BMI at https://www.west-esparza.com/. Cholesterol- Have your cholesterol checked regularly starting at age 49, sooner may be necessary if you have diabetes, high blood pressure, if a family member developed heart diseases at an early age or if you smoke.  Chlamydia, HIV, and other sexual transmitted disease- Get screened each year until the age of 81 then within three months of each new sexual partner. Diabetes- Have your blood sugar checked regularly if you have high blood pressure, high cholesterol, a family history of diabetes or if you are overweight.   Get these vaccines Flu shot- Every fall. Tetanus shot- Every 10 years. Menactra- Single dose; prevents meningitis.   Take these steps Don't smoke- If you do smoke, ask your healthcare provider about quitting. For tips on how to quit, go to www.smokefree.gov or call 1-800-QUIT-NOW. Be physically active- Exercise 5 days a week for at least 30 minutes.  If you are not already physically active start slow and gradually work up to 30 minutes of moderate physical activity.  Examples of moderate activity include walking briskly, mowing the yard, dancing,  swimming bicycling, etc. Eat a healthy diet- Eat a variety of healthy foods such as fruits, vegetables, low fat milk, low fat cheese, yogurt, lean meats, poultry, fish, beans, tofu, etc.  For more information on healthy eating, go to www.thenutritionsource.org Drink alcohol in moderation- Limit alcohol intake two drinks or less a day.  Never drink and drive. Dentist- Brush and floss teeth twice daily; visit your dentis twice a year. Depression-Your emotional health is as important as your physical health.  If you're feeling down, losing interest in things you normally enjoy please talk with your healthcare provider. Gun Safety- If you keep a gun in your home, keep it unloaded and with the safety lock on.  Bullets should be stored separately. Helmet use- Always wear a helmet when riding a motorcycle, bicycle, rollerblading or skateboarding. Safe sex- If you may be exposed to a sexually transmitted infection, use a condom Seat belts- Seat bels can save your life; always wear one. Smoke/Carbon Monoxide detectors- These detectors need to be installed on the appropriate level of your home.  Replace batteries at least once a year. Skin Cancer- When out in the sun, cover up and use sunscreen SPF 15 or higher. Violence- If anyone is threatening or hurting you, please tell your healthcare provider.

## 2021-05-05 NOTE — Discharge Instructions (Addendum)
Patient sent to the hospital via EMS. 

## 2021-05-05 NOTE — ED Provider Notes (Signed)
Emily Small    CSN: [id number] Arrival date & time: 05/05/21  1836      History   Chief Complaint Chief Complaint  Patient presents with   Hypertension    HPI Emily Small is a 40 y.o. female.   Patient presents today for further evaluation of elevated blood pressure and heart rate.  Patient was seen at PCP earlier this morning for an appointment to establish Small.  Patient reports that she did have elevated blood pressure at that visit but states that she has whitecoat syndrome and that this is normal for her.  Was prescribed amlodipine with a telephone visit after PCP visit that she has not yet taken.  Patient left PCP visit and went to [employer] to have blood pressure checked.  Patient reports that systolic blood pressure was in the 200s at [facility] with this blood pressure recheck.  Patient then went home and blood pressure was 150 systolic.  Patient has been concerned for elevated blood pressure and heart rate throughout the day, so she came to urgent Small today for further evaluation.  Patient has had a headache for approximately 1 week but denies chest pain, shortness of breath, palpitations, dizziness, blurred vision, nausea, vomiting.  Patient does have history of panic attacks that she attributed her symptoms to earlier in the day but is now concerned due to to how long symptoms are lasting.   Hypertension   Past Medical History:  Diagnosis Date   Anxiety     Patient Active Problem List   Diagnosis Date Noted   Essential hypertension 05/05/2021   Microcytic anemia 04/22/2019   Menorrhagia 04/22/2019   Normal labor and delivery 03/21/2016   NSVD (normal spontaneous vaginal delivery) 03/21/2016    History reviewed. No pertinent surgical history.  OB History     Gravida  4   Para  4   Term  4   Preterm  0   AB  0   Living  4      SAB  0   IAB  0   Ectopic  0   Multiple  0   Live Births  4            Home Medications     Prior to Admission medications   Medication Sig Start Date End Date Taking? Authorizing Provider  amLODipine (NORVASC) 5 MG tablet Take 1 tablet (5 mg total) by mouth daily. 05/05/21 06/04/21  Rema Fendt, NP  citalopram (CELEXA) 20 MG tablet Take 1 tablet (20 mg total) by mouth daily. 05/05/21   Rema Fendt, NP  Iron, Ferrous Sulfate, 325 (65 Fe) MG TABS Take 325 mg by mouth daily. 04/23/19   Joseph Art, DO  tranexamic acid (LYSTEDA) 650 MG TABS tablet Take 2 tablets (1,300 mg total) by mouth 3 (three) times daily. Take during menses for a maximum of five days Patient not taking: Reported on 05/05/2021 11/09/20   Conan Bowens, MD    Family History Family History  Problem Relation Age of Onset   Heart disease Mother    Hyperlipidemia Mother    Hypertension Father    Diabetes Father    Heart disease Father     Social History Social History   Tobacco Use   Smoking status: Former    Types: E-cigarettes   Smokeless tobacco: Never  Vaping Use   Vaping Use: Never used  Substance Use Topics   Alcohol use: Yes    Alcohol/week: 12.0 standard  drinks    Types: 12 Cans of beer per week   Drug use: No     Allergies   Patient has no known allergies.   Review of Systems Review of Systems Per HPI  Physical Exam Triage Vital Signs ED Triage Vitals  Enc Vitals Group     BP 05/05/21 1844 (!) 197/137     Pulse Rate 05/05/21 1844 (!) 151     Resp 05/05/21 1844 18     Temp 05/05/21 1844 98.9 F (37.2 C)     Temp Source 05/05/21 1844 Oral     SpO2 05/05/21 1844 98 %     Weight --      Height --      Head Circumference --      Peak Flow --      Pain Score 05/05/21 1845 0     Pain Loc --      Pain Edu? --      Excl. in GC? --    No data found.  Updated Vital Signs BP (!) 197/137 (BP Location: Left Arm)   Pulse (!) 151   Temp 98.9 F (37.2 C) (Oral)   Resp 18   SpO2 98%   Visual Acuity Right Eye Distance:   Left Eye Distance:   Bilateral Distance:     Right Eye Near:   Left Eye Near:    Bilateral Near:     Physical Exam Constitutional:      Appearance: Normal appearance.  HENT:     Head: Normocephalic and atraumatic.  Eyes:     Extraocular Movements: Extraocular movements intact.     Conjunctiva/sclera: Conjunctivae normal.  Cardiovascular:     Rate and Rhythm: Normal rate and regular rhythm.     Pulses: Normal pulses.     Heart sounds: Normal heart sounds.  Pulmonary:     Effort: Pulmonary effort is normal. No respiratory distress.     Breath sounds: Normal breath sounds. No wheezing.  Skin:    General: Skin is warm and dry.  Neurological:     General: No focal deficit present.     Mental Status: She is alert and oriented to person, place, and time. Mental status is at baseline.     Cranial Nerves: Cranial nerves are intact.     Sensory: Sensation is intact.     Motor: Motor function is intact.     Coordination: Coordination is intact.     Gait: Gait is intact.  Psychiatric:        Mood and Affect: Mood normal.        Behavior: Behavior normal.        Thought Content: Thought content normal.        Judgment: Judgment normal.     UC Treatments / Results  Labs (all labs ordered are listed, but only abnormal results are displayed) Labs Reviewed - No data to display  EKG   Radiology No results found.  Procedures Procedures (including critical Small time)  Medications Ordered in UC Medications - No data to display  Initial Impression / Assessment and Plan / UC Course  I have reviewed the triage vital signs and the nursing notes.  Pertinent labs & imaging results that were available during my Small of the patient were reviewed by me and considered in my medical decision making (see chart for details).     Due to sustained elevated blood pressure and heart rate, patient will need further evaluation and management at the hospital.  Advised patient that she needs to go the hospital via EMS.  Patient was  agreeable with plan and left via EMS. Final Clinical Impressions(s) / UC Diagnoses   Final diagnoses:  Elevated blood pressure reading  Tachycardia     Discharge Instructions      Patient sent to the hospital via EMS.     ED Prescriptions   None    PDMP not reviewed this encounter.   Lance Muss, FNP 05/05/21 1907

## 2021-05-05 NOTE — Discharge Instructions (Signed)
You were seen in the emergency department for elevated blood pressure and heart rate.  You had lab work done along with an EKG and chest x-ray.  You tested positive for COVID.  This is likely affecting your vital signs.  You will need to rest and drink plenty of fluids.  Follow-up with your doctor.  Continue your regular medications.  Return to the emergency department if any worsening or concerning symptoms

## 2021-05-05 NOTE — ED Triage Notes (Signed)
Pt states she was at primary care next door this morning and her bp was elevated systolic in 170 range. Afterwards in [employer] she checked it again and systolic over 200. Also states she has anxiety and panic attacks and is not sure if it is relevant to her current BP.

## 2021-05-05 NOTE — Telephone Encounter (Signed)
Amlodipine sent per patient request. Please let patient know that an appointment is required for medication refills and that she should follow-up in 2 weeks.

## 2021-05-05 NOTE — ED Triage Notes (Signed)
Ems arrived to transport pt to ed

## 2021-05-05 NOTE — Telephone Encounter (Signed)
Patient stated she was seen today 05/05/2021 and stated she did not want to start on blood pressure medicine at that time. Patient called and is asking for a prescription to be called in. Patient stated she changed her mind.

## 2021-05-06 LAB — TSH: TSH: 1.695 u[IU]/mL (ref 0.350–4.500)

## 2021-06-03 ENCOUNTER — Other Ambulatory Visit: Payer: Self-pay | Admitting: Family

## 2021-06-03 DIAGNOSIS — I10 Essential (primary) hypertension: Secondary | ICD-10-CM

## 2021-06-05 ENCOUNTER — Encounter: Payer: Medicaid Other | Admitting: Family

## 2021-06-07 ENCOUNTER — Other Ambulatory Visit: Payer: Self-pay | Admitting: Family

## 2021-06-07 DIAGNOSIS — I10 Essential (primary) hypertension: Secondary | ICD-10-CM

## 2021-06-08 ENCOUNTER — Other Ambulatory Visit: Payer: Self-pay | Admitting: *Deleted

## 2021-06-08 DIAGNOSIS — I10 Essential (primary) hypertension: Secondary | ICD-10-CM

## 2021-06-08 MED ORDER — AMLODIPINE BESYLATE 5 MG PO TABS: 5.0000 mg | ORAL_TABLET | Freq: Every day | ORAL | 0 refills | Status: DC

## 2021-06-15 NOTE — Progress Notes (Signed)
Patient ID: Emily Small, female    DOB: April 14, 1981  MRN: [id number]  CC: Annual Physical Exam   Subjective: Emily Small is a 40 y.o. female who presents for annual physical exam. Her concerns today include:   ANXIETY FOLLOW-UP: 05/05/2021: - Increase Citalopram from 10 mg daily to 20 mg daily.  - Patient declined referral to Psychiatry.  - Follow-up with primary provider in 4 weeks or sooner if needed.   06/16/2021: Does not feel significant improvement with increase in Citalopram. Not ready to increase Citalopram again. Not ready to add second anxiety medication. Declines referral to counseling services/Psychiatry. Still having some anxiety and panic attacks. Reports she is ok for now. Denies thoughts of self-harm, suicidal ideations, and homicidal ideations.   2. HYPERTENSION FOLLOW-UP: Doing well on current regimen Amlodipine. No side effects. No issues/concerns. Denies chest pain and shortness of breath. Home blood pressures 120's-130's/80's-90's.  3. HOT FLASHES: Endorses hot flashes and night sweats. Having routine periods once monthly heavier than normal recently. LMP 06/12/2021. Family history of mother with early onset menopause in her 5's.   Patient Active Problem List   Diagnosis Date Noted   Essential hypertension 05/05/2021   Microcytic anemia 04/22/2019   Menorrhagia 04/22/2019   Normal labor and delivery 03/21/2016   NSVD (normal spontaneous vaginal delivery) 03/21/2016     Current Outpatient Medications on File Prior to Visit  Medication Sig Dispense Refill   acetaminophen (TYLENOL) 500 MG tablet Take 500 mg by mouth every 6 (six) hours as needed for mild pain, moderate pain or fever.     Iron, Ferrous Sulfate, 325 (65 Fe) MG TABS Take 325 mg by mouth daily. 30 tablet 1   Magnesium 200 MG TABS Take 1 tablet by mouth at bedtime.     tranexamic acid (LYSTEDA) 650 MG TABS tablet Take 2 tablets (1,300 mg total) by mouth 3 (three) times daily. Take during  menses for a maximum of five days (Patient not taking: No sig reported) 30 tablet 2   No current facility-administered medications on file prior to visit.    No Known Allergies  Social History   Socioeconomic History   Marital status: Married    Spouse name: Not on file   Number of children: Not on file   Years of education: Not on file   Highest education level: Not on file  Occupational History   Not on file  Tobacco Use   Smoking status: Former    Types: E-cigarettes   Smokeless tobacco: Never  Vaping Use   Vaping Use: Never used  Substance and Sexual Activity   Alcohol use: Yes    Alcohol/week: 12.0 standard drinks    Types: 12 Cans of beer per week   Drug use: No   Sexual activity: Yes    Birth control/protection: None  Other Topics Concern   Not on file  Social History Narrative   Not on file   Social Determinants of Health   Financial Resource Strain: Not on file  Food Insecurity: Not on file  Transportation Needs: Not on file  Physical Activity: Not on file  Stress: Not on file  Social Connections: Not on file  Intimate Partner Violence: Not on file    Family History  Problem Relation Age of Onset   Heart disease Mother    Hyperlipidemia Mother    Hypertension Father    Diabetes Father    Heart disease Father     No past surgical history on file.  ROS: Review of Systems Negative except as stated above  PHYSICAL EXAM: BP (!) 148/92 (BP Location: Right Arm, Patient Position: Sitting, Cuff Size: Normal)   Pulse (!) 104   Resp 16   Ht 5' 2.5" (1.588 m)   Wt 154 lb (69.9 kg)   LMP 06/12/2021 (Exact Date)   SpO2 98%   BMI 27.72 kg/m   Physical Exam HENT:     Head: Normocephalic and atraumatic.     Right Ear: Tympanic membrane, ear canal and external ear normal.     Left Ear: Tympanic membrane, ear canal and external ear normal.  Eyes:     Extraocular Movements: Extraocular movements intact.     Conjunctiva/sclera: Conjunctivae normal.      Pupils: Pupils are equal, round, and reactive to light.  Cardiovascular:     Rate and Rhythm: Tachycardia present.     Pulses: Normal pulses.     Heart sounds: Normal heart sounds.  Pulmonary:     Effort: Pulmonary effort is normal.     Breath sounds: Normal breath sounds.  Chest:     Comments: Patient declined exam.  Abdominal:     General: Bowel sounds are normal.     Palpations: Abdomen is soft.  Genitourinary:    Comments: Patient declined exam.  Musculoskeletal:        General: Normal range of motion.     Cervical back: Normal range of motion and neck supple.  Skin:    General: Skin is warm and dry.     Capillary Refill: Capillary refill takes less than 2 seconds.  Neurological:     General: No focal deficit present.     Mental Status: She is alert and oriented to person, place, and time.  Psychiatric:        Mood and Affect: Mood normal.        Behavior: Behavior normal.   ASSESSMENT AND PLAN: 1. Annual physical exam: - Counseled on 150 minutes of exercise per week as tolerated, healthy eating (including decreased daily intake of saturated fats, cholesterol, added sugars, sodium), STI prevention, and routine healthcare maintenance.  2. Screening for metabolic disorder: - BMP last obtained 05/05/2021. - Hepatic Function Panel to screen for liver function.  - Hepatic Function Panel  3. Screening for deficiency anemia: - CBC last obtained 05/05/2021.  4. Diabetes mellitus screening: - Hemoglobin A1c to screen for pre-diabetes/diabetes. - Microalbumin / creatinine urine to screen kidney function.  - Hemoglobin A1c - Microalbumin / creatinine urine ratio  5. Screening cholesterol level: - Lipid panel to screen for high cholesterol.  - Lipid panel  6. Thyroid disorder screen: - TSH last obtained 05/05/2021.  7. Need for hepatitis C screening test: - Hepatitis C antibody to screen for hepatitis C.  - Hepatitis C Antibody  8. Routine eye exam: - Referral to  Ophthalmology for further evaluation and management.  - Ambulatory referral to Ophthalmology  9. Generalized anxiety disorder: - Patient denies thoughts of self-harm, suicidal ideations, homicidal ideations. - Continue Citalopram as prescribed.  - Patient declined referral to counseling services/Psychiatry.  - Follow-up with primary provider in 3 months or sooner if needed.  - citalopram (CELEXA) 20 MG tablet; Take 1 tablet (20 mg total) by mouth daily.  Dispense: 90 tablet; Refill: 0  10. Essential hypertension: - Newly diagnosed September 2022.  - Home blood pressures at goal.  - Continue Amlodipine as prescribed.  - Counseled on blood pressure goal of less than 130/80, low-sodium, DASH diet, medication compliance,  150 minutes of moderate intensity exercise per week as tolerated. Discussed medication compliance, adverse effects. - Follow-up with primary provider in 3 months or sooner if needed.  - amLODipine (NORVASC) 5 MG tablet; Take 1 tablet (5 mg total) by mouth daily. Needs OV for additional refills.  Dispense: 90 tablet; Refill: 0  11. Hot flashes: - Screening for menopause.  - Follicle stimulating hormone - Luteinizing hormone  12. Influenza vaccine refused: - Patient declined.   13. Need for Streptococcus pneumoniae vaccination: - Patient declined.     Patient was given the opportunity to ask questions.  Patient verbalized understanding of the plan and was able to repeat key elements of the plan. Patient was given clear instructions to go to Emergency Department or return to medical center if symptoms don't improve, worsen, or new problems develop.The patient verbalized understanding.   Orders Placed This Encounter  Procedures   Hemoglobin A1c   Microalbumin / creatinine urine ratio   Hepatitis C Antibody   Hepatic Function Panel   Lipid panel   Follicle stimulating hormone   Luteinizing hormone   Ambulatory referral to Ophthalmology    Requested Prescriptions    Signed Prescriptions Disp Refills   amLODipine (NORVASC) 5 MG tablet 90 tablet 0    Sig: Take 1 tablet (5 mg total) by mouth daily. Needs OV for additional refills.   citalopram (CELEXA) 20 MG tablet 90 tablet 0    Sig: Take 1 tablet (20 mg total) by mouth daily.    Return in about 1 year (around 06/16/2022) for Physical per patient preference and 3 months hypertension and anxiety .  Rema Fendt, NP

## 2021-06-16 ENCOUNTER — Ambulatory Visit (INDEPENDENT_AMBULATORY_CARE_PROVIDER_SITE_OTHER): Payer: Medicaid Other | Admitting: Family

## 2021-06-16 ENCOUNTER — Encounter: Payer: Self-pay | Admitting: Family

## 2021-06-16 ENCOUNTER — Other Ambulatory Visit: Payer: Self-pay

## 2021-06-16 VITALS — BP 148/92 | HR 104 | Resp 16 | Ht 62.5 in | Wt 154.0 lb

## 2021-06-16 DIAGNOSIS — Z2821 Immunization not carried out because of patient refusal: Secondary | ICD-10-CM | POA: Diagnosis not present

## 2021-06-16 DIAGNOSIS — I10 Essential (primary) hypertension: Secondary | ICD-10-CM | POA: Diagnosis not present

## 2021-06-16 DIAGNOSIS — Z130 Encounter for screening for diseases of the blood and blood-forming organs and certain disorders involving the immune mechanism: Secondary | ICD-10-CM

## 2021-06-16 DIAGNOSIS — Z0001 Encounter for general adult medical examination with abnormal findings: Secondary | ICD-10-CM | POA: Diagnosis not present

## 2021-06-16 DIAGNOSIS — Z131 Encounter for screening for diabetes mellitus: Secondary | ICD-10-CM

## 2021-06-16 DIAGNOSIS — R232 Flushing: Secondary | ICD-10-CM | POA: Diagnosis not present

## 2021-06-16 DIAGNOSIS — Z13228 Encounter for screening for other metabolic disorders: Secondary | ICD-10-CM

## 2021-06-16 DIAGNOSIS — Z1329 Encounter for screening for other suspected endocrine disorder: Secondary | ICD-10-CM

## 2021-06-16 DIAGNOSIS — F411 Generalized anxiety disorder: Secondary | ICD-10-CM

## 2021-06-16 DIAGNOSIS — Z0100 Encounter for examination of eyes and vision without abnormal findings: Secondary | ICD-10-CM

## 2021-06-16 DIAGNOSIS — Z23 Encounter for immunization: Secondary | ICD-10-CM

## 2021-06-16 DIAGNOSIS — Z13220 Encounter for screening for lipoid disorders: Secondary | ICD-10-CM

## 2021-06-16 DIAGNOSIS — Z0000 Encounter for general adult medical examination without abnormal findings: Secondary | ICD-10-CM

## 2021-06-16 DIAGNOSIS — Z1159 Encounter for screening for other viral diseases: Secondary | ICD-10-CM | POA: Diagnosis not present

## 2021-06-16 MED ORDER — AMLODIPINE BESYLATE 5 MG PO TABS: 5.0000 mg | ORAL_TABLET | Freq: Every day | ORAL | 0 refills | Status: DC

## 2021-06-16 MED ORDER — CITALOPRAM HYDROBROMIDE 20 MG PO TABS: 20.0000 mg | ORAL_TABLET | Freq: Every day | ORAL | 0 refills | Status: DC

## 2021-06-16 NOTE — Patient Instructions (Signed)
Preventive Care 60-41 Years Old, Female Preventive care refers to lifestyle choices and visits with your health care provider that can promote health and wellness. Preventive care visits are also called wellness exams. What can I expect for my preventive care visit? Counseling Your health care provider may ask you questions about your: Medical history, including: Past medical problems. Family medical history. Pregnancy history. Current health, including: Menstrual cycle. Method of birth control. Emotional well-being. Home life and relationship well-being. Sexual activity and sexual health. Lifestyle, including: Alcohol, nicotine or tobacco, and drug use. Access to firearms. Diet, exercise, and sleep habits. Work and work Statistician. Sunscreen use. Safety issues such as seatbelt and bike helmet use. Physical exam Your health care provider will check your: Height and weight. These may be used to calculate your BMI (body mass index). BMI is a measurement that tells if you are at a healthy weight. Waist circumference. This measures the distance around your waistline. This measurement also tells if you are at a healthy weight and may help predict your risk of certain diseases, such as type 2 diabetes and high blood pressure. Heart rate and blood pressure. Body temperature. Skin for abnormal spots. What immunizations do I need? Vaccines are usually given at various ages, according to a schedule. Your health care provider will recommend vaccines for you based on your age, medical history, and lifestyle or other factors, such as travel or where you work. What tests do I need? Screening Your health care provider may recommend screening tests for certain conditions. This may include: Lipid and cholesterol levels. Diabetes screening. This is done by checking your blood sugar (glucose) after you have not eaten for a while (fasting). Pelvic exam and Pap test. Hepatitis B test. Hepatitis C  test. HIV (human immunodeficiency virus) test. STI (sexually transmitted infection) testing, if you are at risk. Lung cancer screening. Colorectal cancer screening. Mammogram. Talk with your health care provider about when you should start having regular mammograms. This may depend on whether you have a family history of breast cancer. BRCA-related cancer screening. This may be done if you have a family history of breast, ovarian, tubal, or peritoneal cancers. Bone density scan. This is done to screen for osteoporosis. Talk with your health care provider about your test results, treatment options, and if necessary, the need for more tests. Follow these instructions at home: Eating and drinking  Eat a diet that includes fresh fruits and vegetables, whole grains, lean protein, and low-fat dairy products. Take vitamin and mineral supplements as recommended by your health care provider. Do not drink alcohol if: Your health care provider tells you not to drink. You are pregnant, may be pregnant, or are planning to become pregnant. If you drink alcohol: Limit how much you have to 0-1 drink a day. Know how much alcohol is in your drink. In the [place] one drink equals one 12 oz bottle of beer (355 mL), one 5 oz glass of wine (148 mL), or one 1 oz glass of hard liquor (44 mL). Lifestyle Brush your teeth every morning and night with fluoride toothpaste. Floss one time each day. Exercise for at least 30 minutes 5 or more days each week. Do not use any products that contain nicotine or tobacco. These products include cigarettes, chewing tobacco, and vaping devices, such as e-cigarettes. If you need help quitting, ask your health care provider. Do not use drugs. If you are sexually active, practice safe sex. Use a condom or other form of protection to prevent  STIs. If you do not wish to become pregnant, use a form of birth control. If you plan to become pregnant, see your health care provider for a  prepregnancy visit. Take aspirin only as told by your health care provider. Make sure that you understand how much to take and what form to take. Work with your health care provider to find out whether it is safe and beneficial for you to take aspirin daily. Find healthy ways to manage stress, such as: Meditation, yoga, or listening to music. Journaling. Talking to a trusted person. Spending time with friends and family. Minimize exposure to UV radiation to reduce your risk of skin cancer. Safety Always wear your seat belt while driving or riding in a vehicle. Do not drive: If you have been drinking alcohol. Do not ride with someone who has been drinking. When you are tired or distracted. While texting. If you have been using any mind-altering substances or drugs. Wear a helmet and other protective equipment during sports activities. If you have firearms in your house, make sure you follow all gun safety procedures. Seek help if you have been physically or sexually abused. What's next? Visit your health care provider once a year for an annual wellness visit. Ask your health care provider how often you should have your eyes and teeth checked. Stay up to date on all vaccines. This information is not intended to replace advice given to you by your health care provider. Make sure you discuss any questions you have with your health care provider. Document Revised: 01/25/2021 Document Reviewed: 01/25/2021 Elsevier Patient Education  [place].

## 2021-06-16 NOTE — Progress Notes (Signed)
Physical, no PAP

## 2021-06-17 LAB — HEMOGLOBIN A1C
Est. average glucose Bld gHb Est-mCnc: 100 mg/dL
Hgb A1c MFr Bld: 5.1 % (ref 4.8–5.6)

## 2021-06-17 LAB — HEPATITIS C ANTIBODY: Hep C Virus Ab: <0.1 s/co ratio (ref 0.0–0.9)

## 2021-06-17 LAB — LIPID PANEL
Chol/HDL Ratio: 4.6 ratio — ABNORMAL HIGH (ref 0.0–4.4)
Cholesterol, Total: 179 mg/dL (ref 100–199)
HDL: 39 mg/dL — ABNORMAL LOW (ref >39)
LDL Chol Calc (NIH): 112 mg/dL — ABNORMAL HIGH (ref 0–99)
Triglycerides: 160 mg/dL — ABNORMAL HIGH (ref 0–149)
VLDL Cholesterol Cal: 28 mg/dL (ref 5–40)

## 2021-06-17 LAB — HEPATIC FUNCTION PANEL
ALT: 11 IU/L (ref 0–32)
AST: 22 IU/L (ref 0–40)
Albumin: 4.5 g/dL (ref 3.8–4.8)
Alkaline Phosphatase: 84 IU/L (ref 44–121)
Bilirubin Total: <0.2 mg/dL (ref 0.0–1.2)
Bilirubin, Direct: <0.1 mg/dL (ref 0.00–0.40)
Total Protein: 6.9 g/dL (ref 6.0–8.5)

## 2021-06-17 LAB — LUTEINIZING HORMONE: LH: 6.5 m[IU]/mL

## 2021-06-17 LAB — FOLLICLE STIMULATING HORMONE: FSH: 10.1 m[IU]/mL

## 2021-06-17 NOTE — Progress Notes (Signed)
Liver function normal.   No diabetes.   Hepatitis C negative.   Hormones (FSH and LH) normal.   Cholesterol higher than expected. High cholesterol may increase risk of heart attack and/or stroke. Consider eating more fruits, vegetables, and lean baked meats such as chicken or fish. Moderate intensity exercise at least 150 minutes as tolerated per week may help as well. No medication needed at the moment. Encouraged to recheck cholesterol at lab only appointment in 3 to 6 months or sooner if needed.   The following is for provider reference only: The 10-year ASCVD risk score (Arnett DK, et al., 2019) is: 3.1%   Values used to calculate the score:     Age: 40 years     Sex: Female     Is Non-Hispanic African American: No     Diabetic: Yes     Tobacco smoker: No     Systolic Blood Pressure: 148 mmHg     Is BP treated: Yes     HDL Cholesterol: 39 mg/dL     Total Cholesterol: 179 mg/dL

## 2021-06-21 LAB — MICROALBUMIN / CREATININE URINE RATIO
Creatinine, Urine: 119.5 mg/dL
Microalb/Creat Ratio: 7 mg/g creat (ref 0–29)
Microalbumin, Urine: 8.8 ug/mL

## 2021-06-21 NOTE — Progress Notes (Signed)
There is no significant amount of protein in the urine.

## 2021-08-24 DIAGNOSIS — H04123 Dry eye syndrome of bilateral lacrimal glands: Secondary | ICD-10-CM | POA: Diagnosis not present

## 2021-08-24 DIAGNOSIS — H40033 Anatomical narrow angle, bilateral: Secondary | ICD-10-CM | POA: Diagnosis not present

## 2021-08-24 DIAGNOSIS — H5213 Myopia, bilateral: Secondary | ICD-10-CM | POA: Diagnosis not present

## 2021-09-12 DIAGNOSIS — H5213 Myopia, bilateral: Secondary | ICD-10-CM | POA: Diagnosis not present

## 2021-09-12 DIAGNOSIS — H1013 Acute atopic conjunctivitis, bilateral: Secondary | ICD-10-CM | POA: Diagnosis not present

## 2021-09-14 NOTE — Progress Notes (Signed)
Patient ID: Emily Small, female    DOB: 1980/08/20  MRN: [id number]  CC: Hypertension Follow-Up  Subjective: Emily Small is a 41 y.o. female who presents for hypertension follow-up.   Her concerns today include:  HYPERTENSION FOLLOW-UP: 06/16/2021: - Continue Amlodipine as prescribed.   09/20/2021: Doing well on current regimen. No side effects. No issues/concerns. Denies chest pain and shortness of breath. Home blood pressures normal.   2. ANXIETY FOLLOW-UP: 06/16/2021: - Continue Citalopram as prescribed.  - Patient declined referral to counseling services/Psychiatry.   09/20/2021: Doing well on current regimen. No issues/concerns. Reports because of controlled anxiety this is the first appointment that she has went to without her husband who is very supportive.  3. MAMMOGRAM: Due and requesting screening.   Depression screen [facility] 2/9 09/20/2021 06/16/2021 05/05/2021 11/09/2020 02/09/2020  Decreased Interest 0 0 0 0 0  Down, Depressed, Hopeless 0 0 0 0 0  PHQ - 2 Score 0 0 0 0 0  Altered sleeping - 1 - 1 1  Tired, decreased energy - 1 - 0 0  Change in appetite - 0 - 0 0  Feeling bad or failure about yourself  - 0 - 0 0  Trouble concentrating - 0 - 0 0  Moving slowly or fidgety/restless - 0 - 0 0  Suicidal thoughts - 0 - 0 0  PHQ-9 Score - 2 - 1 1    Patient Active Problem List   Diagnosis Date Noted   Anxiety disorder 09/20/2021   Gestational diabetes mellitus 09/20/2021   Essential hypertension 05/05/2021   Microcytic anemia 04/22/2019   Menorrhagia 04/22/2019   Normal labor and delivery 03/21/2016   NSVD (normal spontaneous vaginal delivery) 03/21/2016   Maternal age 16+, multigravida, antepartum 09/21/2015   History of anxiety state 08/13/2010     Current Outpatient Medications on File Prior to Visit  Medication Sig Dispense Refill   acetaminophen (TYLENOL) 500 MG tablet Take 500 mg by mouth every 6 (six) hours as needed for mild pain, moderate pain or fever.      Iron, Ferrous Sulfate, 325 (65 Fe) MG TABS Take 325 mg by mouth daily. 30 tablet 1   Magnesium 200 MG TABS Take 1 tablet by mouth at bedtime.     tranexamic acid (LYSTEDA) 650 MG TABS tablet Take 2 tablets (1,300 mg total) by mouth 3 (three) times daily. Take during menses for a maximum of five days (Patient not taking: No sig reported) 30 tablet 2   No current facility-administered medications on file prior to visit.    No Known Allergies  Social History   Socioeconomic History   Marital status: Married    Spouse name: Not on file   Number of children: Not on file   Years of education: Not on file   Highest education level: Not on file  Occupational History   Not on file  Tobacco Use   Smoking status: Former    Types: E-cigarettes    Passive exposure: Never   Smokeless tobacco: Never  Vaping Use   Vaping Use: Never used  Substance and Sexual Activity   Alcohol use: Yes    Alcohol/week: 12.0 standard drinks    Types: 12 Cans of beer per week   Drug use: No   Sexual activity: Yes    Birth control/protection: None  Other Topics Concern   Not on file  Social History Narrative   Not on file   Social Determinants of Corporate investment banker  Strain: Not on file  Food Insecurity: Not on file  Transportation Needs: Not on file  Physical Activity: Not on file  Stress: Not on file  Social Connections: Not on file  Intimate Partner Violence: Not on file    Family History  Problem Relation Age of Onset   Heart disease Mother    Hyperlipidemia Mother    Hypertension Father    Diabetes Father    Heart disease Father     No past surgical history on file.  ROS: Review of Systems Negative except as stated above  PHYSICAL EXAM: BP 129/78 (BP Location: Left Arm, Patient Position: Sitting, Cuff Size: Normal)    Pulse 95    Temp 98.3 F (36.8 C)    Resp 18    Ht 5' 2.52" (1.588 m)    Wt 161 lb (73 kg)    SpO2 98%    BMI 28.96 kg/m   Physical Exam HENT:      Head: Normocephalic and atraumatic.  Eyes:     Extraocular Movements: Extraocular movements intact.     Conjunctiva/sclera: Conjunctivae normal.     Pupils: Pupils are equal, round, and reactive to light.  Cardiovascular:     Rate and Rhythm: Normal rate and regular rhythm.     Pulses: Normal pulses.     Heart sounds: Normal heart sounds.  Pulmonary:     Effort: Pulmonary effort is normal.     Breath sounds: Normal breath sounds.  Musculoskeletal:     Cervical back: Normal range of motion and neck supple.  Neurological:     General: No focal deficit present.     Mental Status: She is alert and oriented to person, place, and time.  Psychiatric:        Mood and Affect: Mood normal.        Behavior: Behavior normal.   ASSESSMENT AND PLAN: 1. Essential (primary) hypertension: - Continue Amlodipine as prescribed.  - Counseled on blood pressure goal of less than 130/80, low-sodium, DASH diet, medication compliance, 150 minutes of moderate intensity exercise per week as tolerated. Discussed medication compliance, adverse effects. - Follow-up with primary provider in 3 months or sooner if needed.  - amLODipine (NORVASC) 5 MG tablet; Take 1 tablet (5 mg total) by mouth daily. Needs OV for additional refills.  Dispense: 90 tablet; Refill: 0  2. GAD (generalized anxiety disorder): - Patient denies thoughts of self-harm, suicidal ideations, homicidal ideations. - Continue Citalopram as prescribed. - Follow-up with primary provider as scheduled.  - citalopram (CELEXA) 20 MG tablet; Take 1 tablet (20 mg total) by mouth daily.  Dispense: 90 tablet; Refill: 0  3. Screening cholesterol level: - Update cholesterol lab. - Lipid panel  4. Encounter for screening mammogram for malignant neoplasm of breast: - Referral for breast cancer screening by mammogram.  - MM Digital Screening; Future   Patient was given the opportunity to ask questions.  Patient verbalized understanding of the plan and  was able to repeat key elements of the plan. Patient was given clear instructions to go to Emergency Department or return to medical center if symptoms don't improve, worsen, or new problems develop.The patient verbalized understanding.   Orders Placed This Encounter  Procedures   MM Digital Screening   Lipid panel     Requested Prescriptions   Signed Prescriptions Disp Refills   amLODipine (NORVASC) 5 MG tablet 90 tablet 0    Sig: Take 1 tablet (5 mg total) by mouth daily. Needs OV for additional  refills.   citalopram (CELEXA) 20 MG tablet 90 tablet 0    Sig: Take 1 tablet (20 mg total) by mouth daily.    Return in about 3 months (around 12/18/2021) for Follow-Up or next available hypertension.  Camillia Herter, NP

## 2021-09-20 ENCOUNTER — Ambulatory Visit (INDEPENDENT_AMBULATORY_CARE_PROVIDER_SITE_OTHER): Payer: Medicaid Other | Admitting: Family

## 2021-09-20 ENCOUNTER — Encounter: Payer: Self-pay | Admitting: Family

## 2021-09-20 ENCOUNTER — Other Ambulatory Visit: Payer: Self-pay

## 2021-09-20 VITALS — BP 129/78 | HR 95 | Temp 98.3°F | Resp 18 | Ht 62.52 in | Wt 161.0 lb

## 2021-09-20 DIAGNOSIS — F411 Generalized anxiety disorder: Secondary | ICD-10-CM

## 2021-09-20 DIAGNOSIS — I10 Essential (primary) hypertension: Secondary | ICD-10-CM

## 2021-09-20 DIAGNOSIS — Z13220 Encounter for screening for lipoid disorders: Secondary | ICD-10-CM | POA: Diagnosis not present

## 2021-09-20 DIAGNOSIS — Z0000 Encounter for general adult medical examination without abnormal findings: Secondary | ICD-10-CM

## 2021-09-20 DIAGNOSIS — O24419 Gestational diabetes mellitus in pregnancy, unspecified control: Secondary | ICD-10-CM | POA: Insufficient documentation

## 2021-09-20 DIAGNOSIS — Z1231 Encounter for screening mammogram for malignant neoplasm of breast: Secondary | ICD-10-CM

## 2021-09-20 DIAGNOSIS — F419 Anxiety disorder, unspecified: Secondary | ICD-10-CM | POA: Insufficient documentation

## 2021-09-20 DIAGNOSIS — Z130 Encounter for screening for diseases of the blood and blood-forming organs and certain disorders involving the immune mechanism: Secondary | ICD-10-CM

## 2021-09-20 DIAGNOSIS — Z13228 Encounter for screening for other metabolic disorders: Secondary | ICD-10-CM

## 2021-09-20 DIAGNOSIS — Z1329 Encounter for screening for other suspected endocrine disorder: Secondary | ICD-10-CM

## 2021-09-20 MED ORDER — AMLODIPINE BESYLATE 5 MG PO TABS: 5.0000 mg | ORAL_TABLET | Freq: Every day | ORAL | 0 refills | Status: DC

## 2021-09-20 MED ORDER — CITALOPRAM HYDROBROMIDE 20 MG PO TABS: 20.0000 mg | ORAL_TABLET | Freq: Every day | ORAL | 0 refills | Status: DC

## 2021-09-20 NOTE — Patient Instructions (Signed)
Citalopram Capsules What is this medication? CITALOPRAM (sye TAL oh pram) treats depression. It increases the amount of serotonin in the brain, a hormone that helps regulate mood. It belongs to a group of medications called SSRIs. This medicine may be used for other purposes; ask your health care provider or pharmacist if you have questions. What should I tell my care team before I take this medication? They need to know if you have any of these conditions: Bipolar disorder or a family history of bipolar disorder Bleeding disorders Glaucoma Heart disease History of irregular heartbeat Kidney disease Liver disease Low levels of magnesium or potassium in the blood Receiving electroconvulsive therapy Seizures Suicidal thoughts, plans, or attempt; a previous suicide attempt by you or a family member Take medications that treat or prevent blood clots Thyroid disease An unusual or allergic reaction to citalopram, escitalopram, other medications, foods, dyes, or preservatives Pregnant or trying to become pregnant Breast-feeding How should I use this medication? Take this medication by mouth with water. Take it as directed on the prescription label at the same time every day. Do not cut, crush or chew this medication. Swallow the capsules whole. You can take it with or without food. Keep taking it unless your care team tells you to stop. Stopping this medication too quickly may cause serious side effects or your condition may worsen. A special MedGuide will be given to you by the pharmacist with each prescription and refill. Be sure to read this information carefully each time. Talk to your care team about the use of this medication in children. Special care may be needed. Patients over 42 years old may have a stronger reaction and need a smaller dose. Overdosage: If you think you have taken too much of this medicine contact a poison control center or emergency room at once. NOTE: This medicine  is only for you. Do not share this medicine with others. What if I miss a dose? If you miss a dose, take it as soon as you can. If it is almost time for your next dose, take only that dose. Do not take double or extra doses. What may interact with this medication? Do not take this medication with any of the following: Certain medications for fungal infections like fluconazole, itraconazole, ketoconazole, posaconazole, voriconazole Cisapride Dronedarone Escitalopram Linezolid MAOIs like Carbex, Eldepryl, Marplan, Nardil, and Parnate Methylene blue (injected into a vein) Pimozide Thioridazine This medication may also interact with the following: Alcohol Amphetamines Aspirin and aspirin-like medications Carbamazepine Certain medications for depression, anxiety, or psychotic disturbances Certain medications for infections like chloroquine, clarithromycin, erythromycin, furazolidone, isoniazid, pentamidine Certain medications for migraine headaches like almotriptan, eletriptan, frovatriptan, naratriptan, rizatriptan, sumatriptan, zolmitriptan Certain medications for sleep Certain medications that treat or prevent blood clots like dalteparin, enoxaparin, warfarin Cimetidine Diuretics Dofetilide Fentanyl Lithium Methadone Metoprolol NSAIDs, medications for pain and inflammation, like ibuprofen or naproxen Omeprazole Other medications that prolong the QT interval (cause an abnormal heart rhythm) Procarbazine Rasagiline Supplements like St. John's wort, kava kava, valerian Tramadol Tryptophan Ziprasidone This list may not describe all possible interactions. Give your health care provider a list of all the medicines, herbs, non-prescription drugs, or dietary supplements you use. Also tell them if you smoke, drink alcohol, or use illegal drugs. Some items may interact with your medicine. What should I watch for while using this medication? Tell your care team if your symptoms do not  get better or if they get worse. Visit your care team for regular checks on your   progress. Because it may take several weeks to see the full effects of this medication, it is important to continue your treatment as prescribed. Watch for new or worsening thoughts of suicide or depression. This includes sudden changes in mood, behavior, or thoughts. These changes can happen at any time but are more common in the beginning of treatment or after a change in dose. Call your care team right away if you experience these thoughts or worsening depression. Manic episodes may happen in patients with bipolar disorder who take this medication. Watch for changes in feelings or behaviors such as feeling anxious, nervous, agitated, panicky, irritable, hostile, aggressive, impulsive, severely restless, overly excited and hyperactive, or trouble sleeping. These symptoms can happen at anytime but are more common in the beginning of treatment or after a change in dose. Call you care team right away if you notice any of these symptoms. You may get drowsy or dizzy. Do not drive, use machinery, or do anything that needs mental alertness until you know how this medication affects you. Do not stand or sit up quickly, especially if you are an older patient. This reduces the risk of dizzy or fainting spells. Alcohol may interfere with the effect of this medication. Avoid alcoholic drinks. Your mouth may get dry. Chewing sugarless gum or sucking hard candy, and drinking plenty of water may help. Contact your care team if the problem does not go away or is severe. What side effects may I notice from receiving this medication? Side effects that you should report to your care team as soon as possible: Allergic reactions--skin rash, itching, hives, swelling of the face, lips, tongue, or throat Bleeding--bloody or black, tar-like stools, red or dark brown urine, vomiting blood or brown material that looks like coffee grounds, small, red or  purple spots on skin, unusual bleeding or bruising Heart rhythm changes--fast or irregular heartbeat, dizziness, feeling faint or lightheaded, chest pain, trouble breathing Low sodium level--muscle weakness, fatigue, dizziness, headache, confusion Serotonin syndrome--irritability, confusion, fast or irregular heartbeat, muscle stiffness, twitching muscles, sweating, high fever, seizure, chills, vomiting, diarrhea Sudden eye pain or change in vision such as blurry vision, seeing halos around lights, vision loss Thoughts of suicide or self-harm, worsening mood, feelings of depression Side effects that usually do not require medical attention (report to your care team if they continue or are bothersome): Change in sex drive or performance Diarrhea Dry mouth Excessive sweating Nausea Tremors or shaking Upset stomach This list may not describe all possible side effects. Call your doctor for medical advice about side effects. You may report side effects to FDA at 1-800-FDA-1088. Where should I keep my medication? Keep out of the reach of children and pets. Store at room temperature between 20 and 25 degrees C (68 and 77 degrees F). Get rid of any unused medication after the expiration date. To get rid of medications that are no longer needed or have expired: Take the medication to a medication take-back program. Check with your pharmacy or law enforcement to find a location. If you cannot return the medication, check the label or package insert to see if the medication should be thrown out in the garbage or flushed down the toilet. If you are not sure, ask your care team. If it is safe to put it in the trash, empty the medication out of the container. Mix the medication with cat litter, dirt, coffee grounds, or other unwanted substance. Seal the mixture in a bag or container. Put it in the   trash. NOTE: This sheet is a summary. It may not cover all possible information. If you have questions about this  medicine, talk to your doctor, pharmacist, or health care provider.  2022 Elsevier/Gold Standard (2020-10-09 00:00:00)  

## 2021-09-20 NOTE — Progress Notes (Signed)
Pt presents for hypertension follow-up  ?

## 2021-09-21 LAB — LIPID PANEL
Chol/HDL Ratio: 4 ratio (ref 0.0–4.4)
Cholesterol, Total: 190 mg/dL (ref 100–199)
HDL: 47 mg/dL (ref >39)
LDL Chol Calc (NIH): 117 mg/dL — ABNORMAL HIGH (ref 0–99)
Triglycerides: 148 mg/dL (ref 0–149)
VLDL Cholesterol Cal: 26 mg/dL (ref 5–40)

## 2021-09-21 NOTE — Progress Notes (Signed)
Cholesterol improved since 3 months ago. No medication needed. Routine screening.   The following is for provider reference only: The 10-year ASCVD risk score (Arnett DK, et al., 2019) is: 2%   Values used to calculate the score:     Age: 41 years     Sex: Female     Is Non-Hispanic African American: No     Diabetic: Yes     Tobacco smoker: No     Systolic Blood Pressure: [id number] mmHg     Is BP treated: Yes     HDL Cholesterol: 47 mg/dL     Total Cholesterol: 190 mg/dL

## 2021-10-11 ENCOUNTER — Other Ambulatory Visit: Payer: Self-pay | Admitting: Family

## 2021-10-11 DIAGNOSIS — Z1231 Encounter for screening mammogram for malignant neoplasm of breast: Secondary | ICD-10-CM

## 2021-10-13 ENCOUNTER — Ambulatory Visit
Admission: RE | Admit: 2021-10-13 | Discharge: 2021-10-13 | Disposition: A | Discharge status: Home / Self Care | Payer: Medicaid Other | Source: Ambulatory Visit | Attending: Family | Admitting: Family

## 2021-10-13 ENCOUNTER — Other Ambulatory Visit: Payer: Self-pay

## 2021-10-13 DIAGNOSIS — Z1231 Encounter for screening mammogram for malignant neoplasm of breast: Secondary | ICD-10-CM

## 2021-10-16 NOTE — Progress Notes (Signed)
Mammogram no evidence of malignancy. Repeat in 12 months.

## 2021-12-09 NOTE — Progress Notes (Signed)
? ? ?Patient ID: Emily Small, female    DOB: Feb 28, 1981  MRN: [id number] ? ?CC: Hypertension Follow-Up ? ?Subjective: ?Emily Small is a 41 y.o. female who presents for hypertension follow-up. ? ?Her concerns today include:  ?Hypertension follow-up: ?09/20/2021: ?- Continue Amlodipine as prescribed.  ? ?12/15/2021: ?Doing well on current regimen. No side effects. No issues/concerns. Denies chest pain, shortness of breath, worst headache of life and additional red flag symptoms. Home blood pressures about the same as today's. ? ?2. Anxiety follow-up: ?09/20/2021: ?- Continue Citalopram as prescribed. ? ?12/15/2021: ?Doing well on current regimen, no issues/concerns. ? ? ?Patient Active Problem List  ? Diagnosis Date Noted  ? Anxiety disorder 09/20/2021  ? Gestational diabetes mellitus 09/20/2021  ? Essential hypertension 05/05/2021  ? Microcytic anemia 04/22/2019  ? Menorrhagia 04/22/2019  ? Normal labor and delivery 03/21/2016  ? NSVD (normal spontaneous vaginal delivery) 03/21/2016  ? Maternal age 9+, multigravida, antepartum 09/21/2015  ? History of anxiety state 08/13/2010  ?  ? ?Current Outpatient Medications on File Prior to Visit  ?Medication Sig Dispense Refill  ? acetaminophen (TYLENOL) 500 MG tablet Take 500 mg by mouth every 6 (six) hours as needed for mild pain, moderate pain or fever.    ? Iron, Ferrous Sulfate, 325 (65 Fe) MG TABS Take 325 mg by mouth daily. 30 tablet 1  ? Magnesium 200 MG TABS Take 1 tablet by mouth at bedtime.    ? tranexamic acid (LYSTEDA) 650 MG TABS tablet Take 2 tablets (1,300 mg total) by mouth 3 (three) times daily. Take during menses for a maximum of five days (Patient not taking: No sig reported) 30 tablet 2  ? ?No current facility-administered medications on file prior to visit.  ? ? ?No Known Allergies ? ?Social History  ? ?Socioeconomic History  ? Marital status: Married  ?  Spouse name: Not on file  ? Number of children: Not on file  ? Years of education: Not on file  ?  Highest education level: Not on file  ?Occupational History  ? Not on file  ?Tobacco Use  ? Smoking status: Former  ?  Types: E-cigarettes  ?  Passive exposure: Never  ? Smokeless tobacco: Never  ?Vaping Use  ? Vaping Use: Never used  ?Substance and Sexual Activity  ? Alcohol use: Yes  ?  Alcohol/week: 12.0 standard drinks  ?  Types: 12 Cans of beer per week  ? Drug use: No  ? Sexual activity: Yes  ?  Birth control/protection: None  ?Other Topics Concern  ? Not on file  ?Social History Narrative  ? Not on file  ? ?Social Determinants of Health  ? ?Financial Resource Strain: Not on file  ?Food Insecurity: Not on file  ?Transportation Needs: Not on file  ?Physical Activity: Not on file  ?Stress: Not on file  ?Social Connections: Not on file  ?Intimate Partner Violence: Not on file  ? ? ?Family History  ?Problem Relation Age of Onset  ? Heart disease Mother   ? Hyperlipidemia Mother   ? Hypertension Father   ? Diabetes Father   ? Heart disease Father   ? Breast cancer Paternal Grandmother   ? ? ?No past surgical history on file. ? ?ROS: ?Review of Systems ?Negative except as stated above ? ?PHYSICAL EXAM: ?BP 127/73 (BP Location: Left Arm, Patient Position: Sitting, Cuff Size: Large)   Pulse 96   Temp 98.3 ?F (36.8 ?C)   Resp 18   Ht 5' 2.52" (1.588  m)   Wt 163 lb (73.9 kg)   SpO2 98%   BMI 29.32 kg/m?  ? ?Physical Exam ?HENT:  ?   Head: Normocephalic and atraumatic.  ?Eyes:  ?   Extraocular Movements: Extraocular movements intact.  ?   Conjunctiva/sclera: Conjunctivae normal.  ?   Pupils: Pupils are equal, round, and reactive to light.  ?Cardiovascular:  ?   Rate and Rhythm: Normal rate and regular rhythm.  ?   Pulses: Normal pulses.  ?   Heart sounds: Normal heart sounds.  ?Pulmonary:  ?   Effort: Pulmonary effort is normal.  ?   Breath sounds: Normal breath sounds.  ?Musculoskeletal:  ?   Cervical back: Normal range of motion and neck supple.  ?Neurological:  ?   General: No focal deficit present.  ?   Mental  Status: She is alert and oriented to person, place, and time.  ?Psychiatric:     ?   Mood and Affect: Mood normal.     ?   Behavior: Behavior normal.  ? ? ?ASSESSMENT AND PLAN: ?1. Essential (primary) hypertension: ?- Continue Amlodipine as prescribed.  ?- Counseled on blood pressure goal of less than 130/80, low-sodium, DASH diet, medication compliance, and 150 minutes of moderate intensity exercise per week as tolerated. Counseled on medication adherence and adverse effects. ?- BMP to evaluate kidney function and electrolyte balance. ?- Follow-up with primary provider in 4 months or sooner if needed.  ?- Basic Metabolic Panel ?- amLODipine (NORVASC) 5 MG tablet; Take 1 tablet (5 mg total) by mouth daily. Needs OV for additional refills.  Dispense: 30 tablet; Refill: 3 ? ?2. GAD (generalized anxiety disorder): ?- Patient denies thoughts of self-harm, suicidal ideations, homicidal ideations. ?- Continue Citalopram as prescribed.  ?- Follow-up with primary provider in 4 months or sooner if needed.  ?- citalopram (CELEXA) 20 MG tablet; Take 1 tablet (20 mg total) by mouth daily.  Dispense: 30 tablet; Refill: 3 ? ? ? ?Patient was given the opportunity to ask questions.  Patient verbalized understanding of the plan and was able to repeat key elements of the plan. Patient was given clear instructions to go to Emergency Department or return to medical center if symptoms don't improve, worsen, or new problems develop.The patient verbalized understanding. ? ? ?Orders Placed This Encounter  ?Procedures  ? Basic Metabolic Panel  ? ? ? ?Requested Prescriptions  ? ?Signed Prescriptions Disp Refills  ? amLODipine (NORVASC) 5 MG tablet 30 tablet 3  ?  Sig: Take 1 tablet (5 mg total) by mouth daily. Needs OV for additional refills.  ? citalopram (CELEXA) 20 MG tablet 30 tablet 3  ?  Sig: Take 1 tablet (20 mg total) by mouth daily.  ? ? ?Return in about 4 months (around 04/16/2022) for Follow-Up or next available HTN . ? ?Rema Fendt, NP  ?

## 2021-12-14 ENCOUNTER — Encounter: Payer: Self-pay | Admitting: Family

## 2021-12-14 ENCOUNTER — Ambulatory Visit (INDEPENDENT_AMBULATORY_CARE_PROVIDER_SITE_OTHER): Payer: Medicaid Other | Admitting: Family

## 2021-12-14 VITALS — BP 127/73 | HR 96 | Temp 98.3°F | Resp 18 | Ht 62.52 in | Wt 163.0 lb

## 2021-12-14 DIAGNOSIS — F411 Generalized anxiety disorder: Secondary | ICD-10-CM

## 2021-12-14 DIAGNOSIS — I10 Essential (primary) hypertension: Secondary | ICD-10-CM | POA: Diagnosis not present

## 2021-12-14 MED ORDER — CITALOPRAM HYDROBROMIDE 20 MG PO TABS: 20.0000 mg | ORAL_TABLET | Freq: Every day | ORAL | 3 refills | Status: DC

## 2021-12-14 MED ORDER — AMLODIPINE BESYLATE 5 MG PO TABS: 5.0000 mg | ORAL_TABLET | Freq: Every day | ORAL | 3 refills | Status: DC

## 2021-12-14 NOTE — Progress Notes (Signed)
Pt presents for hypertension and anxiety f/u ?

## 2021-12-15 LAB — BASIC METABOLIC PANEL
BUN/Creatinine Ratio: 17 (ref 9–23)
BUN: 14 mg/dL (ref 6–24)
CO2: 22 mmol/L (ref 20–29)
Calcium: 9.3 mg/dL (ref 8.7–10.2)
Chloride: 102 mmol/L (ref 96–106)
Creatinine, Ser: 0.83 mg/dL (ref 0.57–1.00)
Glucose: 93 mg/dL (ref 70–99)
Potassium: 4.2 mmol/L (ref 3.5–5.2)
Sodium: 141 mmol/L (ref 134–144)
eGFR: 91 mL/min/{1.73_m2} (ref >59)

## 2021-12-15 NOTE — Progress Notes (Signed)
Kidney function and electrolytes normal.

## 2022-04-09 NOTE — Progress Notes (Signed)
Patient ID: Emily Small, female    DOB: [birth year]  MRN: [id number]  CC: Chronic Care Management   Subjective: Emily Small is a 41 y.o. female who presents for chronic care management. She is accompanied by her husband.   Her concerns today include:  - Doing well on Amlodipine and Celexa without issues or concerns.  - Difficulty getting to sleep during nighttime. Doesn't feel anxiety worsening and nothing in particular on her mind keeping her awake. Drinking coffee during morning hours. Occasionally vaping. Naps during the day. Would like to try magnesium supplement to help with sleep. No further issues or concerns.    Patient Active Problem List   Diagnosis Date Noted   Amenorrhea 04/18/2022   Anxiety disorder 09/20/2021   Gestational diabetes mellitus 09/20/2021   Essential hypertension 05/05/2021   Microcytic anemia 04/22/2019   Menorrhagia 04/22/2019   Normal labor and delivery 03/21/2016   NSVD (normal spontaneous vaginal delivery) 03/21/2016   Multigravida of advanced maternal age 24/06/2016   History of anxiety state 08/13/2010     Current Outpatient Medications on File Prior to Visit  Medication Sig Dispense Refill   acetaminophen (TYLENOL) 500 MG tablet Take 500 mg by mouth every 6 (six) hours as needed for mild pain, moderate pain or fever.     ibuprofen (ADVIL) 800 MG tablet Take 800 mg by mouth every 6 (six) hours as needed.     Iron, Ferrous Sulfate, 325 (65 Fe) MG TABS Take 325 mg by mouth daily. 30 tablet 1   Magnesium 200 MG TABS Take 1 tablet by mouth at bedtime.     No current facility-administered medications on file prior to visit.    No Known Allergies  Social History   Socioeconomic History   Marital status: Married    Spouse name: Not on file   Number of children: Not on file   Years of education: Not on file   Highest education level: Not on file  Occupational History   Not on file  Tobacco Use   Smoking status: Former    Types:  E-cigarettes    Passive exposure: Never   Smokeless tobacco: Never  Vaping Use   Vaping Use: Never used  Substance and Sexual Activity   Alcohol use: Yes    Alcohol/week: 12.0 standard drinks of alcohol    Types: 12 Cans of beer per week   Drug use: No   Sexual activity: Yes    Birth control/protection: None  Other Topics Concern   Not on file  Social History Narrative   Not on file   Social Determinants of Health   Financial Resource Strain: Not on file  Food Insecurity: Not on file  Transportation Needs: Not on file  Physical Activity: Not on file  Stress: Not on file  Social Connections: Not on file  Intimate Partner Violence: Not on file    Family History  Problem Relation Age of Onset   Heart disease Mother    Hyperlipidemia Mother    Hypertension Father    Diabetes Father    Heart disease Father    Breast cancer Paternal Grandmother     No past surgical history on file.  ROS: Review of Systems Negative except as stated above  PHYSICAL EXAM: BP 135/84 (BP Location: Right Arm, Patient Position: Sitting, Cuff Size: Large)   Pulse 90   Temp 98 F (36.7 C)   Resp 18   Wt 150 lb (68 kg)   SpO2 98%  BMI 26.98 kg/m   Physical Exam HENT:     Head: Normocephalic and atraumatic.  Eyes:     Extraocular Movements: Extraocular movements intact.     Conjunctiva/sclera: Conjunctivae normal.     Pupils: Pupils are equal, round, and reactive to light.  Cardiovascular:     Rate and Rhythm: Normal rate and regular rhythm.     Pulses: Normal pulses.     Heart sounds: Normal heart sounds.  Pulmonary:     Effort: Pulmonary effort is normal.     Breath sounds: Normal breath sounds.  Musculoskeletal:     Cervical back: Normal range of motion and neck supple.  Neurological:     General: No focal deficit present.     Mental Status: She is alert and oriented to person, place, and time.  Psychiatric:        Mood and Affect: Mood normal.        Behavior: Behavior  normal.     ASSESSMENT AND PLAN: 1. Essential (primary) hypertension - Continue Amlodipine as prescribed.  - Counseled on blood pressure goal of less than 130/80, low-sodium, DASH diet, medication compliance, and 150 minutes of moderate intensity exercise per week as tolerated. Counseled on medication adherence and adverse effects. - Patient plans to return at later date for magnesium lab. - Follow-up with primary provider in 3 months or sooner if needed.  - Magnesium; Future - amLODipine (NORVASC) 5 MG tablet; Take 1 tablet (5 mg total) by mouth daily. Needs OV for additional refills.  Dispense: 30 tablet; Refill: 2  2. GAD (generalized anxiety disorder) - Patient denies thoughts of self-harm, suicidal ideations, homicidal ideations. - Continue Citalopram as prescribed.  - Follow-up with primary provider in 3 months or sooner if needed.  - citalopram (CELEXA) 20 MG tablet; Take 1 tablet (20 mg total) by mouth daily.  Dispense: 30 tablet; Refill: 2  3. Insomnia, unspecified type - Discussed sleep hygiene and natural measures to support promoting sleep.  - Patient declined pharmacological therapy. - Follow-up with primary provider as scheduled.     Patient was given the opportunity to ask questions.  Patient verbalized understanding of the plan and was able to repeat key elements of the plan. Patient was given clear instructions to go to Emergency Department or return to medical center if symptoms don't improve, worsen, or new problems develop.The patient verbalized understanding.   Orders Placed This Encounter  Procedures   Magnesium     Requested Prescriptions   Signed Prescriptions Disp Refills   amLODipine (NORVASC) 5 MG tablet 30 tablet 2    Sig: Take 1 tablet (5 mg total) by mouth daily. Needs OV for additional refills.   citalopram (CELEXA) 20 MG tablet 30 tablet 2    Sig: Take 1 tablet (20 mg total) by mouth daily.    Return in about 3 months (around 07/18/2022) for  Follow-Up or next available chronic care mgmt .  Rema Fendt, NP

## 2022-04-18 ENCOUNTER — Encounter: Payer: Self-pay | Admitting: Family

## 2022-04-18 ENCOUNTER — Ambulatory Visit (INDEPENDENT_AMBULATORY_CARE_PROVIDER_SITE_OTHER): Payer: Self-pay | Admitting: Family

## 2022-04-18 VITALS — BP 135/84 | HR 90 | Temp 98.0°F | Resp 18 | Wt 150.0 lb

## 2022-04-18 DIAGNOSIS — F411 Generalized anxiety disorder: Secondary | ICD-10-CM

## 2022-04-18 DIAGNOSIS — I10 Essential (primary) hypertension: Secondary | ICD-10-CM

## 2022-04-18 DIAGNOSIS — G4700 Insomnia, unspecified: Secondary | ICD-10-CM

## 2022-04-18 DIAGNOSIS — N912 Amenorrhea, unspecified: Secondary | ICD-10-CM | POA: Insufficient documentation

## 2022-04-18 MED ORDER — CITALOPRAM HYDROBROMIDE 20 MG PO TABS: 20.0000 mg | ORAL_TABLET | Freq: Every day | ORAL | 2 refills | Status: DC

## 2022-04-18 MED ORDER — AMLODIPINE BESYLATE 5 MG PO TABS: 5.0000 mg | ORAL_TABLET | Freq: Every day | ORAL | 2 refills | Status: DC

## 2022-04-18 NOTE — Progress Notes (Signed)
.  Pt presents for chronic care management   -has questions about magnesium

## 2022-07-06 IMAGING — MG MM DIGITAL SCREENING BILAT W/ TOMO AND CAD
8 series · 9 of 24 positions shown · non-contrast
Comparison: None.

CLINICAL DATA: Screening.

EXAM:
DIGITAL SCREENING BILATERAL MAMMOGRAM WITH TOMOSYNTHESIS AND CAD
TECHNIQUE: Bilateral screening digital craniocaudal and mediolateral oblique
mammograms were obtained. Bilateral screening digital breast
tomosynthesis was performed. The images were evaluated with
computer-aided detection.

[R CC synth-2D]
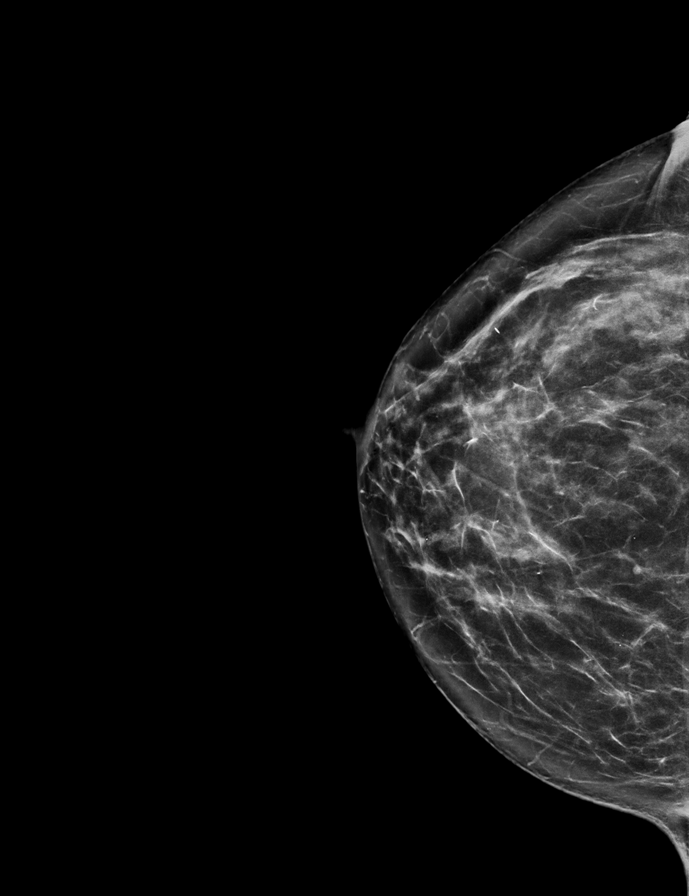

[L MLO synth-2D]
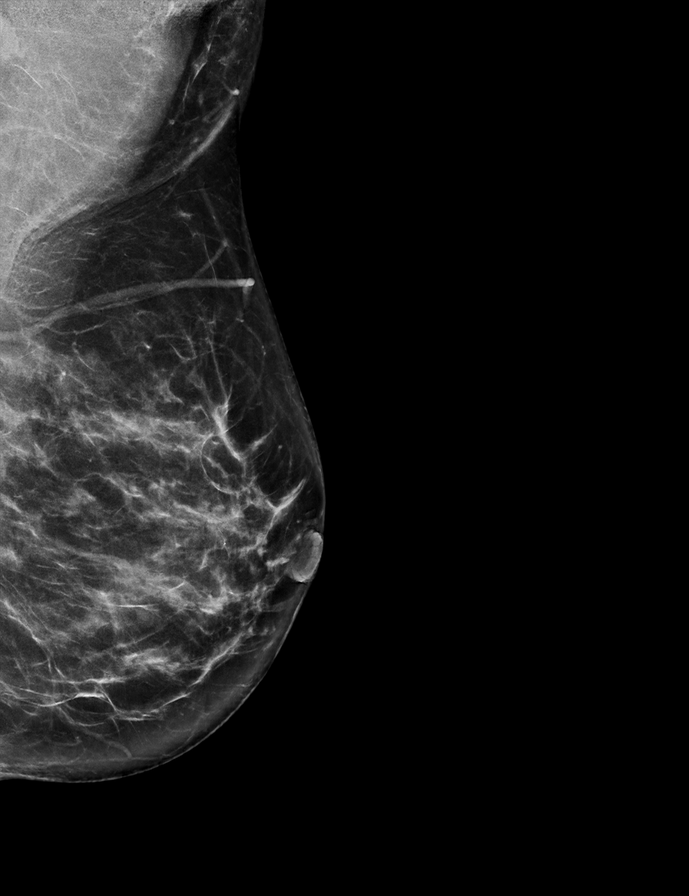

[L CC synth-2D]
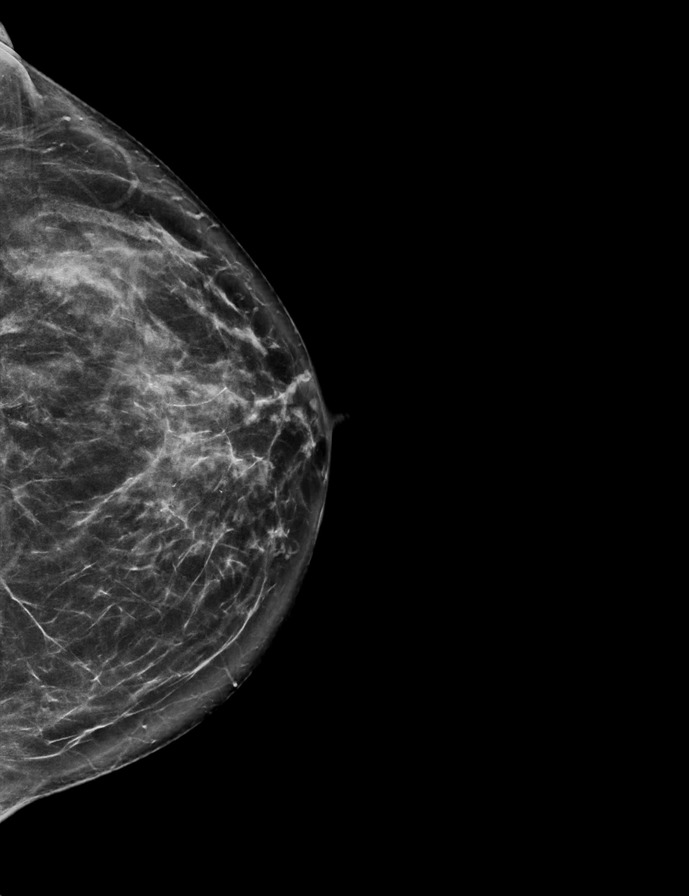

[R MLO synth-2D]
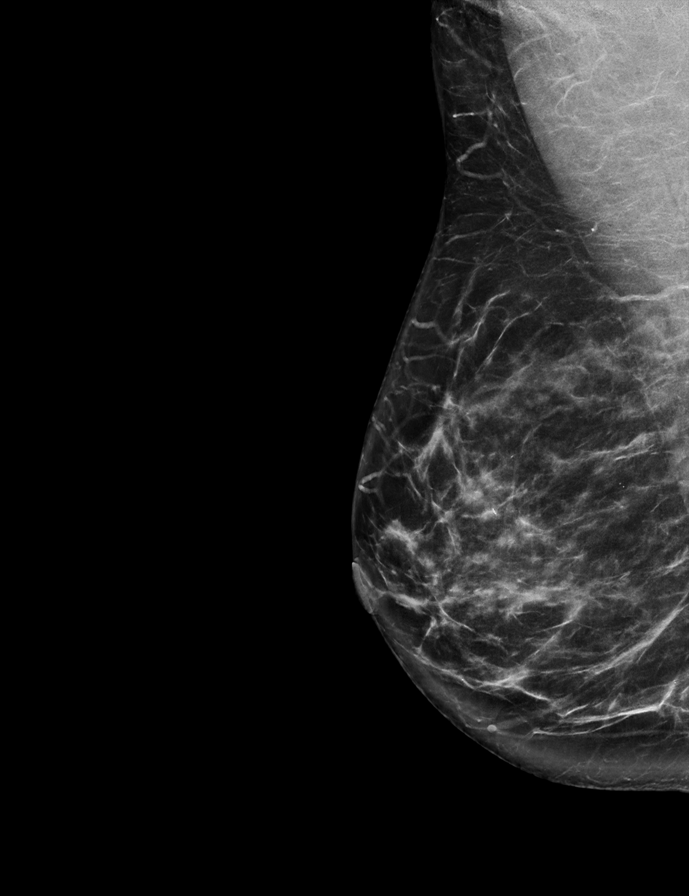

[R MLO tomo · 2 of 71 frames shown]
[frame 23/71]
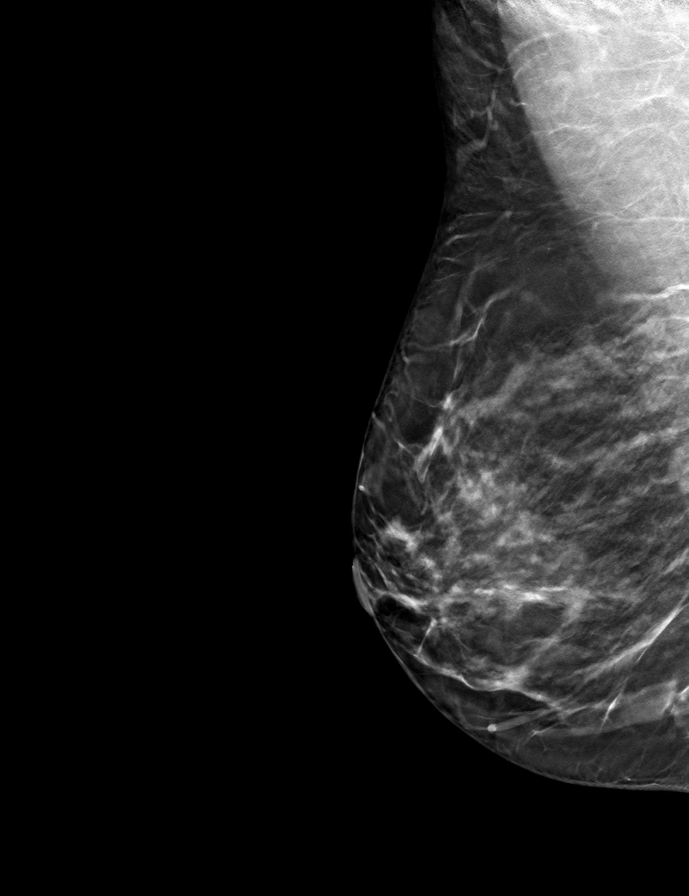
[frame 36/71]
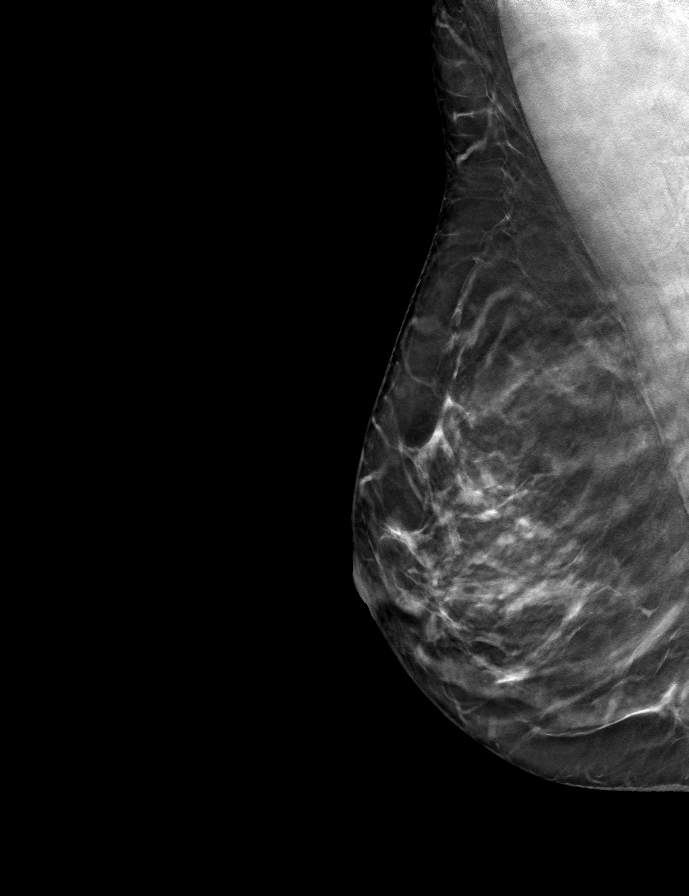

[L CC tomo · tomo slice 35/68.0]
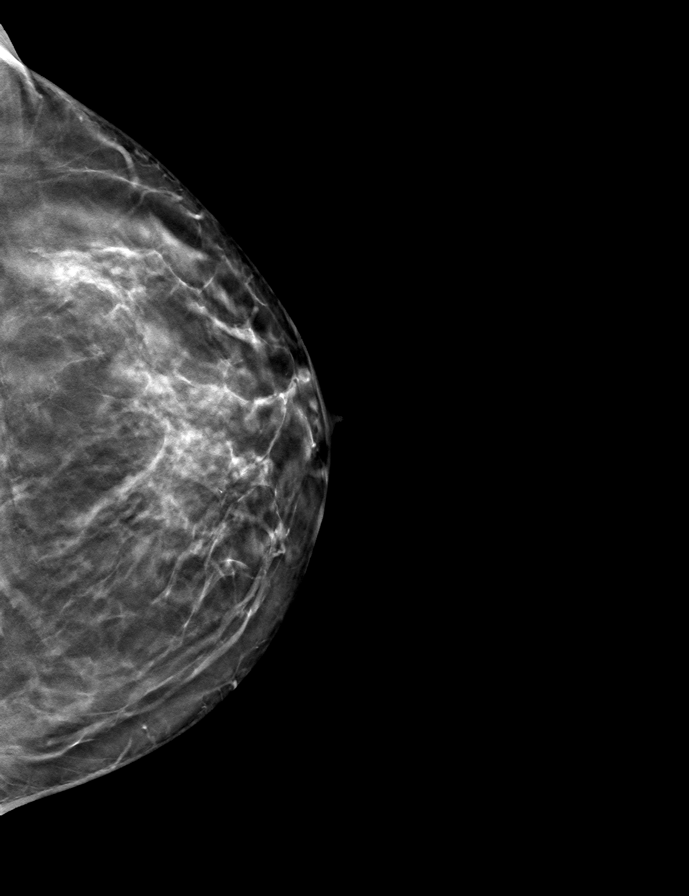

[L MLO tomo · tomo slice 37/72.0]
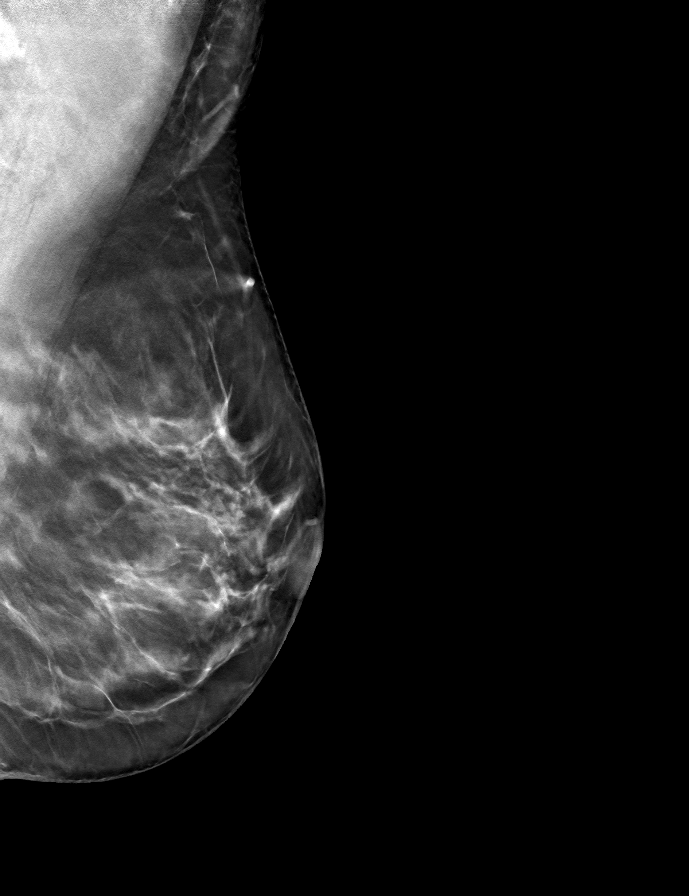

[R CC tomo · tomo slice 35/68.0]
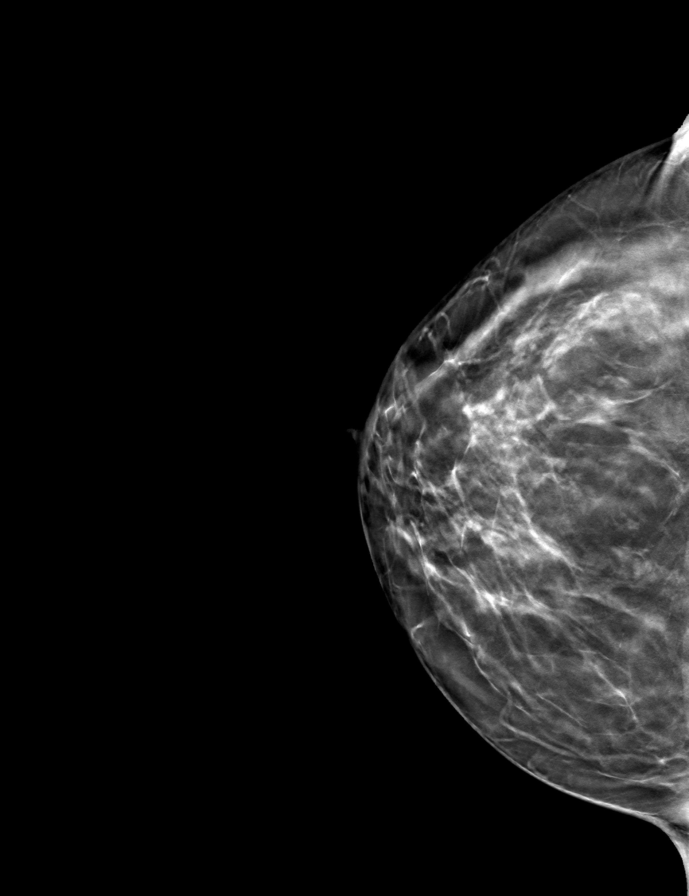

[9 of 24 positions shown; findings below may reference images not displayed]

ACR Breast Density Category c: The breast tissue is heterogeneously
dense, which may obscure small masses
FINDINGS: There are no findings suspicious for malignancy.
IMPRESSION: No mammographic evidence of malignancy. A result letter of this
screening mammogram will be mailed directly to the patient.

RECOMMENDATION:
Screening mammogram in one year. (Code:C8-T-HNK)

BI-RADS CATEGORY  1: Negative.

## 2022-07-11 NOTE — Progress Notes (Signed)
Patient ID: Emily Small, female    DOB: 1980/08/23  MRN: [id number]  CC: Chronic Care Management  Subjective: Emily Small is a 41 y.o. female who presents for chronic care management. She is accompanied by her husband.   Her concerns today include:  - Doing well on blood pressure and anxiety medications, no issues/concerns. - Concern for deviated septum due to persisting right side nasal congestion. When she presses right maxillary area congestion clears and shortly returns after. Using over-the-counter Afrin to help. Declined respiratory panel.  - Concerns for hot flashes and early menopause. Family history of early menopause of her mother. Considered trying an over-the-counter test to see if she is in early menopause.  - History of gestational diabetes. Managed with diet. - History of polyp causing anemia.  - We discussed obtaining labs today versus returning for an annual physical exam. Patient agreeable to returning for an annual physical exam (fasting) when best for her.  - No further issues/concerns.       07/17/2022    8:48 AM 09/20/2021    9:11 AM 06/16/2021    8:27 AM 05/05/2021   10:15 AM 11/09/2020   10:44 AM  Depression screen PHQ 2/9  Decreased Interest 0 0 0 0 0  Down, Depressed, Hopeless 0 0 0 0 0  PHQ - 2 Score 0 0 0 0 0  Altered sleeping   1  1  Tired, decreased energy   1  0  Change in appetite   0  0  Feeling bad or failure about yourself    0  0  Trouble concentrating   0  0  Moving slowly or fidgety/restless   0  0  Suicidal thoughts   0  0  PHQ-9 Score   2  1    Patient Active Problem List   Diagnosis Date Noted   Amenorrhea 04/18/2022   Anxiety disorder 09/20/2021   Gestational diabetes mellitus 09/20/2021   Essential hypertension 05/05/2021   Microcytic anemia 04/22/2019   Menorrhagia 04/22/2019   Normal labor and delivery 03/21/2016   NSVD (normal spontaneous vaginal delivery) 03/21/2016   Multigravida of advanced maternal age 32/06/2016    History of anxiety state 08/13/2010     Current Outpatient Medications on File Prior to Visit  Medication Sig Dispense Refill   acetaminophen (TYLENOL) 500 MG tablet Take 500 mg by mouth every 6 (six) hours as needed for mild pain, moderate pain or fever.     ibuprofen (ADVIL) 800 MG tablet Take 800 mg by mouth every 6 (six) hours as needed.     Iron, Ferrous Sulfate, 325 (65 Fe) MG TABS Take 325 mg by mouth daily. 30 tablet 1   Magnesium 200 MG TABS Take 1 tablet by mouth at bedtime.     No current facility-administered medications on file prior to visit.    No Known Allergies  Social History   Socioeconomic History   Marital status: Married    Spouse name: Not on file   Number of children: Not on file   Years of education: Not on file   Highest education level: Not on file  Occupational History   Not on file  Tobacco Use   Smoking status: Former    Types: E-cigarettes    Passive exposure: Never   Smokeless tobacco: Never  Vaping Use   Vaping Use: Never used  Substance and Sexual Activity   Alcohol use: Yes    Alcohol/week: 12.0 standard drinks of alcohol  Types: 12 Cans of beer per week   Drug use: No   Sexual activity: Yes    Birth control/protection: None  Other Topics Concern   Not on file  Social History Narrative   Not on file   Social Determinants of Health   Financial Resource Strain: Not on file  Food Insecurity: Not on file  Transportation Needs: Not on file  Physical Activity: Not on file  Stress: Not on file  Social Connections: Not on file  Intimate Partner Violence: Not on file    Family History  Problem Relation Age of Onset   Heart disease Mother    Hyperlipidemia Mother    Hypertension Father    Diabetes Father    Heart disease Father    Breast cancer Paternal Grandmother     No past surgical history on file.  ROS: Review of Systems Negative except as stated above  PHYSICAL EXAM: BP 136/79 (BP Location: Left Arm, Patient  Position: Sitting, Cuff Size: Normal)   Pulse 96   Temp 98.3 F (36.8 C)   Resp 16   Ht 5' 2.52" (1.588 m)   Wt 148 lb (67.1 kg)   SpO2 99%   BMI 26.62 kg/m   Physical Exam HENT:     Head: Normocephalic and atraumatic.     Nose: Congestion present.     Mouth/Throat:     Mouth: Mucous membranes are moist.     Pharynx: Oropharynx is clear.  Eyes:     Extraocular Movements: Extraocular movements intact.     Conjunctiva/sclera: Conjunctivae normal.     Pupils: Pupils are equal, round, and reactive to light.  Cardiovascular:     Rate and Rhythm: Normal rate and regular rhythm.     Pulses: Normal pulses.     Heart sounds: Normal heart sounds.  Pulmonary:     Effort: Pulmonary effort is normal.     Breath sounds: Normal breath sounds.  Musculoskeletal:     Cervical back: Normal range of motion and neck supple.  Neurological:     General: No focal deficit present.     Mental Status: She is alert and oriented to person, place, and time.  Psychiatric:        Mood and Affect: Mood normal.        Behavior: Behavior normal.     ASSESSMENT AND PLAN: 1. Primary hypertension - Continue Amlodipine as prescribed.  - Counseled on blood pressure goal of less than 130/80, low-sodium, DASH diet, medication compliance, and 150 minutes of moderate intensity exercise per week as tolerated. Counseled on medication adherence and adverse effects. - Follow-up with primary provider in 3 months or sooner if needed.  - amLODipine (NORVASC) 5 MG tablet; Take 1 tablet (5 mg total) by mouth daily. Needs OV for additional refills.  Dispense: 30 tablet; Refill: 2  2. GAD (generalized anxiety disorder) - Patient denies thoughts of self-harm, suicidal ideations, homicidal ideations. - Continue Citalopram as prescribed.  - Follow-up with primary provider in 3 months or sooner if needed.  - citalopram (CELEXA) 20 MG tablet; Take 1 tablet (20 mg total) by mouth daily.  Dispense: 30 tablet; Refill: 2  3.  Chronic nasal congestion - Referral to ENT for further evaluation/management.  - Ambulatory referral to ENT   Patient was given the opportunity to ask questions.  Patient verbalized understanding of the plan and was able to repeat key elements of the plan. Patient was given clear instructions to go to Emergency Department or return  to medical center if symptoms don't improve, worsen, or new problems develop.The patient verbalized understanding.   Orders Placed This Encounter  Procedures   Ambulatory referral to ENT     Requested Prescriptions   Signed Prescriptions Disp Refills   amLODipine (NORVASC) 5 MG tablet 30 tablet 2    Sig: Take 1 tablet (5 mg total) by mouth daily. Needs OV for additional refills.   citalopram (CELEXA) 20 MG tablet 30 tablet 2    Sig: Take 1 tablet (20 mg total) by mouth daily.    Return in about 3 months (around 10/16/2022) for Follow-Up or next available chronic care mgmt and physical per patient preference.  Rema Fendt, NP

## 2022-07-17 ENCOUNTER — Ambulatory Visit (INDEPENDENT_AMBULATORY_CARE_PROVIDER_SITE_OTHER): Payer: Medicaid Other | Admitting: Family

## 2022-07-17 ENCOUNTER — Encounter: Payer: Self-pay | Admitting: Family

## 2022-07-17 VITALS — BP 136/79 | HR 96 | Temp 98.3°F | Resp 16 | Ht 62.52 in | Wt 148.0 lb

## 2022-07-17 DIAGNOSIS — R0981 Nasal congestion: Secondary | ICD-10-CM | POA: Diagnosis not present

## 2022-07-17 DIAGNOSIS — F411 Generalized anxiety disorder: Secondary | ICD-10-CM

## 2022-07-17 DIAGNOSIS — I10 Essential (primary) hypertension: Secondary | ICD-10-CM | POA: Diagnosis not present

## 2022-07-17 MED ORDER — CITALOPRAM HYDROBROMIDE 20 MG PO TABS: 20.0000 mg | ORAL_TABLET | Freq: Every day | ORAL | 2 refills | Status: DC

## 2022-07-17 MED ORDER — AMLODIPINE BESYLATE 5 MG PO TABS: 5.0000 mg | ORAL_TABLET | Freq: Every day | ORAL | 2 refills | Status: DC

## 2022-07-17 NOTE — Progress Notes (Signed)
.  Pt presents for chronic care management   

## 2022-07-17 NOTE — Patient Instructions (Signed)
Hypertension, Adult High blood pressure (hypertension) is when the force of blood pumping through the arteries is too strong. The arteries are the blood vessels that carry blood from the heart throughout the body. Hypertension forces the heart to work harder to pump blood and may cause arteries to become narrow or stiff. Untreated or uncontrolled hypertension can lead to a heart attack, heart failure, a stroke, kidney disease, and other problems. A blood pressure reading consists of a higher number over a lower number. Ideally, your blood pressure should be below 120/80. The first ("top") number is called the systolic pressure. It is a measure of the pressure in your arteries as your heart beats. The second ("bottom") number is called the diastolic pressure. It is a measure of the pressure in your arteries as the heart relaxes. What are the causes? The exact cause of this condition is not known. There are some conditions that result in high blood pressure. What increases the risk? Certain factors may make you more likely to develop high blood pressure. Some of these risk factors are under your control, including: Smoking. Not getting enough exercise or physical activity. Being overweight. Having too much fat, sugar, calories, or salt (sodium) in your diet. Drinking too much alcohol. Other risk factors include: Having a personal history of heart disease, diabetes, high cholesterol, or kidney disease. Stress. Having a family history of high blood pressure and high cholesterol. Having obstructive sleep apnea. Age. The risk increases with age. What are the signs or symptoms? High blood pressure may not cause symptoms. Very high blood pressure (hypertensive crisis) may cause: Headache. Fast or irregular heartbeats (palpitations). Shortness of breath. Nosebleed. Nausea and vomiting. Vision changes. Severe chest pain, dizziness, and seizures. How is this diagnosed? This condition is diagnosed by  measuring your blood pressure while you are seated, with your arm resting on a flat surface, your legs uncrossed, and your feet flat on the floor. The cuff of the blood pressure monitor will be placed directly against the skin of your upper arm at the level of your heart. Blood pressure should be measured at least twice using the same arm. Certain conditions can cause a difference in blood pressure between your right and left arms. If you have a high blood pressure reading during one visit or you have normal blood pressure with other risk factors, you may be asked to: Return on a different day to have your blood pressure checked again. Monitor your blood pressure at home for 1 week or longer. If you are diagnosed with hypertension, you may have other blood or imaging tests to help your health care provider understand your overall risk for other conditions. How is this treated? This condition is treated by making healthy lifestyle changes, such as eating healthy foods, exercising more, and reducing your alcohol intake. You may be referred for counseling on a healthy diet and physical activity. Your health care provider may prescribe medicine if lifestyle changes are not enough to get your blood pressure under control and if: Your systolic blood pressure is above 130. Your diastolic blood pressure is above 80. Your personal target blood pressure may vary depending on your medical conditions, your age, and other factors. Follow these instructions at home: Eating and drinking  Eat a diet that is high in fiber and potassium, and low in sodium, added sugar, and fat. An example of this eating plan is called the DASH diet. DASH stands for Dietary Approaches to Stop Hypertension. To eat this way: Eat   plenty of fresh fruits and vegetables. Try to fill one half of your plate at each meal with fruits and vegetables. Eat whole grains, such as whole-wheat pasta, brown rice, or whole-grain bread. Fill about one  fourth of your plate with whole grains. Eat or drink low-fat dairy products, such as skim milk or low-fat yogurt. Avoid fatty cuts of meat, processed or cured meats, and poultry with skin. Fill about one fourth of your plate with lean proteins, such as fish, chicken without skin, beans, eggs, or tofu. Avoid pre-made and processed foods. These tend to be higher in sodium, added sugar, and fat. Reduce your daily sodium intake. Many people with hypertension should eat less than 1,500 mg of sodium a day. Do not drink alcohol if: Your health care provider tells you not to drink. You are pregnant, may be pregnant, or are planning to become pregnant. If you drink alcohol: Limit how much you have to: 0-1 drink a day for women. 0-2 drinks a day for men. Know how much alcohol is in your drink. In the [place] one drink equals one 12 oz bottle of beer (355 mL), one 5 oz glass of wine (148 mL), or one 1 oz glass of hard liquor (44 mL). Lifestyle  Work with your health care provider to maintain a healthy body weight or to lose weight. Ask what an ideal weight is for you. Get at least 30 minutes of exercise that causes your heart to beat faster (aerobic exercise) most days of the week. Activities may include walking, swimming, or biking. Include exercise to strengthen your muscles (resistance exercise), such as Pilates or lifting weights, as part of your weekly exercise routine. Try to do these types of exercises for 30 minutes at least 3 days a week. Do not use any products that contain nicotine or tobacco. These products include cigarettes, chewing tobacco, and vaping devices, such as e-cigarettes. If you need help quitting, ask your health care provider. Monitor your blood pressure at home as told by your health care provider. Keep all follow-up visits. This is important. Medicines Take over-the-counter and prescription medicines only as told by your health care provider. Follow directions carefully. Blood  pressure medicines must be taken as prescribed. Do not skip doses of blood pressure medicine. Doing this puts you at risk for problems and can make the medicine less effective. Ask your health care provider about side effects or reactions to medicines that you should watch for. Contact a health care provider if you: Think you are having a reaction to a medicine you are taking. Have headaches that keep coming back (recurring). Feel dizzy. Have swelling in your ankles. Have trouble with your vision. Get help right away if you: Develop a severe headache or confusion. Have unusual weakness or numbness. Feel faint. Have severe pain in your chest or abdomen. Vomit repeatedly. Have trouble breathing. These symptoms may be an emergency. Get help right away. Call 911. Do not wait to see if the symptoms will go away. Do not drive yourself to the hospital. Summary Hypertension is when the force of blood pumping through your arteries is too strong. If this condition is not controlled, it may put you at risk for serious complications. Your personal target blood pressure may vary depending on your medical conditions, your age, and other factors. For most people, a normal blood pressure is less than 120/80. Hypertension is treated with lifestyle changes, medicines, or a combination of both. Lifestyle changes include losing weight, eating a healthy,   low-sodium diet, exercising more, and limiting alcohol. This information is not intended to replace advice given to you by your health care provider. Make sure you discuss any questions you have with your health care provider. Document Revised: 06/06/2021 Document Reviewed: 06/06/2021 [employer] Patient Education  2023 [employer]  

## 2022-09-14 ENCOUNTER — Ambulatory Visit: Payer: Medicaid Other | Attending: Physician Assistant | Admitting: Physician Assistant

## 2022-09-14 ENCOUNTER — Encounter: Payer: Self-pay | Admitting: Physician Assistant

## 2022-09-14 VITALS — BP 141/85 | HR 93 | Temp 99.2°F | Resp 16 | Wt 150.0 lb

## 2022-09-14 DIAGNOSIS — N3000 Acute cystitis without hematuria: Secondary | ICD-10-CM

## 2022-09-14 LAB — POCT URINALYSIS DIP (CLINITEK)
Bilirubin, UA: NEGATIVE
Blood, UA: NEGATIVE
Glucose, UA: NEGATIVE mg/dL
Ketones, POC UA: NEGATIVE mg/dL
Nitrite, UA: POSITIVE — AB
POC PROTEIN,UA: NEGATIVE
Spec Grav, UA: 1.015 (ref 1.010–1.025)
Urobilinogen, UA: 0.2 E.U./dL
pH, UA: 6 (ref 5.0–8.0)

## 2022-09-14 MED ORDER — FLUCONAZOLE 150 MG PO TABS: 150.0000 mg | ORAL_TABLET | Freq: Once | ORAL | 0 refills | Status: AC

## 2022-09-14 MED ORDER — SULFAMETHOXAZOLE-TRIMETHOPRIM 800-160 MG PO TABS: 1.0000 | ORAL_TABLET | Freq: Two times a day (BID) | ORAL | 0 refills | Status: DC

## 2022-09-14 NOTE — Progress Notes (Signed)
Patient ID: Emily Small, female   DOB: 1981/07/08, 42 y.o.   MRN: [id number]   [name], is a 42 y.o. female  MWN:[id number]  [id number]:[id number]  DOB - 1981/02/18  Chief Complaint  Patient presents with   Urinary Frequency       Subjective:   Emily Small is a 42 y.o. female here today for 3 day h/o  Burning, urgency, frequency of urination.  No vaginal discharge.  Drove to [facility] yesterday and symptoms worsened after that.  No fever. No N/V/D.    ALLERGIES: No Known Allergies  PAST MEDICAL HISTORY: Past Medical History:  Diagnosis Date   Anxiety     MEDICATIONS AT HOME: Prior to Admission medications   Medication Sig Start Date End Date Taking? Authorizing Provider  acetaminophen (TYLENOL) 500 MG tablet Take 500 mg by mouth every 6 (six) hours as needed for mild pain, moderate pain or fever.   Yes [provider]  amLODipine (NORVASC) 5 MG tablet Take 1 tablet (5 mg total) by mouth daily. Needs OV for additional refills. 07/17/22 10/15/22 Yes Minette Brine, Amy J, NP  citalopram (CELEXA) 20 MG tablet Take 1 tablet (20 mg total) by mouth daily. 07/17/22 10/15/22 Yes Minette Brine, Amy J, NP  fluconazole (DIFLUCAN) 150 MG tablet Take 1 tablet (150 mg total) by mouth once for 1 dose. 09/14/22 09/14/22 Yes Mitchael Luckey, Dionne Bucy, PA-C  ibuprofen (ADVIL) 800 MG tablet Take 800 mg by mouth every 6 (six) hours as needed. 01/16/22  Yes [provider]  Iron, Ferrous Sulfate, 325 (65 Fe) MG TABS Take 325 mg by mouth daily. 04/23/19  Yes Eulogio Bear U, DO  Magnesium 200 MG TABS Take 1 tablet by mouth at bedtime.   Yes [provider]  sulfamethoxazole-trimethoprim (BACTRIM DS) 800-160 MG tablet Take 1 tablet by mouth 2 (two) times daily. 09/14/22  Yes Cheyann Blecha, Dionne Bucy, PA-C    ROS: Neg HEENT Neg resp Neg cardiac Neg GI Neg MS Neg psych Neg neuro  Objective:   Vitals:   09/14/22 1607  BP: (!) 133/92  Pulse: (!) 109  Resp: 16  Temp: 99.2 F (37.3 C)  TempSrc: Oral   SpO2: (!) 9%  Weight: 150 lb (68 kg)   Pulse ox=98%  Exam General appearance : Awake, alert, not in any distress. Speech Clear. Not toxic looking HEENT: Atraumatic and Normocephalic Neck: Supple, no JVD. No cervical lymphadenopathy.  Chest: Good air entry bilaterally, CTAB.  No rales/rhonchi/wheezing CVS: S1 S2 regular, no murmurs.  Extremities: B/L Lower Ext shows no edema, both legs are warm to touch Neurology: Awake alert, and oriented X 3, CN II-XII intact, Non focal Skin: No Rash  Data Review Lab Results  Component Value Date   HGBA1C 5.1 06/16/2021    Assessment & Plan   1. Acute cystitis without hematuria Increase water intake - POCT URINALYSIS DIP (CLINITEK) - Urine Culture - sulfamethoxazole-trimethoprim (BACTRIM DS) 800-160 MG tablet; Take 1 tablet by mouth 2 (two) times daily.  Dispense: 14 tablet; Refill: 0 - fluconazole (DIFLUCAN) 150 MG tablet; Take 1 tablet (150 mg total) by mouth once for 1 dose.  Dispense: 1 tablet; Refill: 0    Return for keep physical appt with Durene Fruits.  The patient was given clear instructions to go to ER or return to medical center if symptoms don't improve, worsen or new problems develop. The patient verbalized understanding. The patient was told to call to get lab results if they haven't heard anything in the next week.  Freeman Caldron, PA-C [facility] [place], [place]   09/14/2022, 4:40 PM

## 2022-09-14 NOTE — Patient Instructions (Addendum)
Drink 80-100 ounces water daily  Urinary Tract Infection, Adult A urinary tract infection (UTI) is an infection of any part of the urinary tract. The urinary tract includes: The kidneys. The ureters. The bladder. The urethra. These organs make, store, and get rid of pee (urine) in the body. What are the causes? This infection is caused by germs (bacteria) in your genital area. These germs grow and cause swelling (inflammation) of your urinary tract. What increases the risk? The following factors may make you more likely to develop this condition: Using a small, thin tube (catheter) to drain pee. Not being able to control when you pee or poop (incontinence). Being female. If you are female, these things can increase the risk: Using these methods to prevent pregnancy: A medicine that kills sperm (spermicide). A device that blocks sperm (diaphragm). Having low levels of a female hormone (estrogen). Being pregnant. You are more likely to develop this condition if: You have genes that add to your risk. You are sexually active. You take antibiotic medicines. You have trouble peeing because of: A prostate that is bigger than normal, if you are female. A blockage in the part of your body that drains pee from the bladder. A kidney stone. A nerve condition that affects your bladder. Not getting enough to drink. Not peeing often enough. You have other conditions, such as: Diabetes. A weak disease-fighting system (immune system). Sickle cell disease. Gout. Injury of the spine. Blood pressure goal <130/<85  What are the signs or symptoms? Symptoms of this condition include: Needing to pee right away. Peeing small amounts often. Pain or burning when peeing. Blood in the pee. Pee that smells bad or not like normal. Trouble peeing. Pee that is cloudy. Fluid coming from the vagina, if you are female. Pain in the belly or lower back. Other symptoms include: Vomiting. Not feeling  hungry. Feeling mixed up (confused). This may be the first symptom in older adults. Being tired and grouchy (irritable). A fever. Watery poop (diarrhea). How is this treated? Taking antibiotic medicine. Taking other medicines. Drinking enough water. In some cases, you may need to see a specialist. Follow these instructions at home:  Medicines Take over-the-counter and prescription medicines only as told by your doctor. If you were prescribed an antibiotic medicine, take it as told by your doctor. Do not stop taking it even if you start to feel better. General instructions Make sure you: Pee until your bladder is empty. Do not hold pee for a long time. Empty your bladder after sex. Wipe from front to back after peeing or pooping if you are a female. Use each tissue one time when you wipe. Drink enough fluid to keep your pee pale yellow. Keep all follow-up visits. Contact a doctor if: You do not get better after 1-2 days. Your symptoms go away and then come back. Get help right away if: You have very bad back pain. You have very bad pain in your lower belly. You have a fever. You have chills. You feeling like you will vomit or you vomit. Summary A urinary tract infection (UTI) is an infection of any part of the urinary tract. This condition is caused by germs in your genital area. There are many risk factors for a UTI. Treatment includes antibiotic medicines. Drink enough fluid to keep your pee pale yellow. This information is not intended to replace advice given to you by your health care provider. Make sure you discuss any questions you have with your health care  provider. Document Revised: 03/11/2020 Document Reviewed: 03/11/2020 Elsevier Patient Education  [employer].

## 2022-09-14 NOTE — Progress Notes (Signed)
Scanty and frequent urination since Tuesday Taking AZO

## 2022-09-18 LAB — URINE CULTURE

## 2022-10-05 ENCOUNTER — Other Ambulatory Visit: Payer: Self-pay | Admitting: Family

## 2022-10-05 DIAGNOSIS — Z1231 Encounter for screening mammogram for malignant neoplasm of breast: Secondary | ICD-10-CM

## 2022-10-10 NOTE — Progress Notes (Signed)
Patient ID: Emily Small, female    DOB: 1981-07-28  MRN: [id number]  CC: Annual Physical Exam  Subjective: Emily Small is a 42 y.o. female who presents for annual physical exam. She is accompanied by her husband.   Her concerns today include:  - Doing well on blood pressure medication, no issues/concerns. She denies red flag symptoms such as but not limited to chest pain, shortness of breath, worst headache of life, nausea/vomiting.  - Doing well on depression medication. She denies thoughts of self-harm, suicidal ideations, homicidal ideations. - Reports she recently had her mammogram.  - Reports she recently had an eye exam.  - Reports irregular periods and history of uterine polyp. Requests hormone testing. - Requests vitamins and magnesium to be screened. Reports she is taking a daily multivitamin with iron and an additional daily iron supplement.  - Requests referral to Dermatology for evaluation of several skin spots of diffuse body. She denies any associated symptoms. States her mother has history of the same. She denies personal and family history of skin cancer or other related disorders.   Patient Active Problem List   Diagnosis Date Noted   Amenorrhea 04/18/2022   Anxiety disorder 09/20/2021   Gestational diabetes mellitus 09/20/2021   Essential hypertension 05/05/2021   Microcytic anemia 04/22/2019   Menorrhagia 04/22/2019   Normal labor and delivery 03/21/2016   NSVD (normal spontaneous vaginal delivery) 03/21/2016   Multigravida of advanced maternal age 79/06/2016   History of anxiety state 08/13/2010     Current Outpatient Medications on File Prior to Visit  Medication Sig Dispense Refill   acetaminophen (TYLENOL) 500 MG tablet Take 500 mg by mouth every 6 (six) hours as needed for mild pain, moderate pain or fever.     ibuprofen (ADVIL) 800 MG tablet Take 800 mg by mouth every 6 (six) hours as needed.     Iron, Ferrous Sulfate, 325 (65 Fe) MG TABS Take 325  mg by mouth daily. 30 tablet 1   Magnesium 200 MG TABS Take 1 tablet by mouth at bedtime.     No current facility-administered medications on file prior to visit.    No Known Allergies  Social History   Socioeconomic History   Marital status: Married    Spouse name: Not on file   Number of children: Not on file   Years of education: Not on file   Highest education level: Not on file  Occupational History   Not on file  Tobacco Use   Smoking status: Former    Types: E-cigarettes    Passive exposure: Never   Smokeless tobacco: Never  Vaping Use   Vaping Use: Never used  Substance and Sexual Activity   Alcohol use: Yes    Alcohol/week: 12.0 standard drinks of alcohol    Types: 12 Cans of beer per week   Drug use: No   Sexual activity: Yes    Birth control/protection: None  Other Topics Concern   Not on file  Social History Narrative   Not on file   Social Determinants of Health   Financial Resource Strain: Not on file  Food Insecurity: Not on file  Transportation Needs: Not on file  Physical Activity: Not on file  Stress: Not on file  Social Connections: Not on file  Intimate Partner Violence: Not on file    Family History  Problem Relation Age of Onset   Heart disease Mother    Hyperlipidemia Mother    Hypertension Father  Diabetes Father    Heart disease Father    Breast cancer Paternal Grandmother     No past surgical history on file.  ROS: Review of Systems Negative except as stated above  PHYSICAL EXAM: BP 131/83 (BP Location: Left Arm, Patient Position: Sitting, Cuff Size: Normal)   Pulse (!) 103   Temp 98.3 F (36.8 C)   Resp 16   Ht 5' 2.52" (1.588 m)   Wt 145 lb (65.8 kg)   SpO2 98%   BMI 26.08 kg/m   Physical Exam HENT:     Head: Normocephalic and atraumatic.     Right Ear: Tympanic membrane, ear canal and external ear normal.     Left Ear: Tympanic membrane, ear canal and external ear normal.     Nose: Nose normal.      Mouth/Throat:     Mouth: Mucous membranes are moist.     Pharynx: Oropharynx is clear.  Eyes:     Extraocular Movements: Extraocular movements intact.     Conjunctiva/sclera: Conjunctivae normal.     Pupils: Pupils are equal, round, and reactive to light.  Cardiovascular:     Rate and Rhythm: Tachycardia present.     Pulses: Normal pulses.     Heart sounds: Normal heart sounds.  Pulmonary:     Effort: Pulmonary effort is normal.     Breath sounds: Normal breath sounds.  Chest:     Comments: Patient declined.  Abdominal:     General: Bowel sounds are normal.     Palpations: Abdomen is soft.  Genitourinary:    Comments: Patient declined.  Musculoskeletal:        General: Normal range of motion.     Right shoulder: Normal.     Left shoulder: Normal.     Right upper arm: Normal.     Left upper arm: Normal.     Right elbow: Normal.     Left elbow: Normal.     Right forearm: Normal.     Left forearm: Normal.     Right wrist: Normal.     Left wrist: Normal.     Right hand: Normal.     Left hand: Normal.     Cervical back: Normal, normal range of motion and neck supple.     Thoracic back: Normal.     Lumbar back: Normal.     Right hip: Normal.     Left hip: Normal.     Right upper leg: Normal.     Left upper leg: Normal.     Right knee: Normal.     Left knee: Normal.     Right lower leg: Normal.     Left lower leg: Normal.     Right ankle: Normal.     Left ankle: Normal.     Right foot: Normal.     Left foot: Normal.  Skin:    General: Skin is warm and dry.     Capillary Refill: Capillary refill takes less than 2 seconds.  Neurological:     General: No focal deficit present.     Mental Status: She is alert and oriented to person, place, and time.  Psychiatric:        Mood and Affect: Mood normal.        Behavior: Behavior normal.     ASSESSMENT AND PLAN: 1. Annual physical exam - Counseled on 150 minutes of exercise per week as tolerated, healthy eating  (including decreased daily intake of saturated fats, cholesterol, added sugars,  sodium), STI prevention, and routine healthcare maintenance.  2. Screening for metabolic disorder - Routine screening.  - CMP14+EGFR - Magnesium  3. Screening for deficiency anemia - Routine screening.  - CBC  4. Screening cholesterol level - Routine screening.  - Lipid panel  5. Thyroid disorder screen - Routine screening.  - TSH  6. Diabetes mellitus screening 7. History of gestational diabetes - Routine screening.  - Microalbumin / creatinine urine ratio - Hemoglobin A1c  8. Primary hypertension - Continue Amlodipine as prescribed.  - Counseled on blood pressure goal of less than 130/80, low-sodium, DASH diet, medication compliance, and 150 minutes of moderate intensity exercise per week as tolerated. Counseled on medication adherence and adverse effects. - Follow-up with primary provider in 6 months or sooner if needed.  - amLODipine (NORVASC) 5 MG tablet; Take 1 tablet (5 mg total) by mouth daily.  Dispense: 30 tablet; Refill: 2  9. GAD (generalized anxiety disorder) - Patient denies thoughts of self-harm, suicidal ideations, homicidal ideations. - Continue Citalopram as prescribed.  - Follow-up with primary provider in 6 months or sooner if needed.  - citalopram (CELEXA) 20 MG tablet; Take 1 tablet (20 mg total) by mouth daily.  Dispense: 30 tablet; Refill: 2  10. Encounter for vitamin deficiency screening - Routine screening.  - Vitamin D, 25-hydroxy - Vitamin B12  11. Irregular menses 12. Uterine polyp - Routine screening.  - Referral to Gynecology for further evaluation/management.  - Follicle stimulating hormone - Luteinizing hormone - Ambulatory referral to Gynecology  13. Skin abnormalities - Referral to Dermatology for further evaluation/management.  - Ambulatory referral to Dermatology   Patient was given the opportunity to ask questions.  Patient verbalized  understanding of the plan and was able to repeat key elements of the plan. Patient was given clear instructions to go to Emergency Department or return to medical center if symptoms don't improve, worsen, or new problems develop.The patient verbalized understanding.   Orders Placed This Encounter  Procedures   Microalbumin / creatinine urine ratio   CBC   Lipid panel   TSH   CMP14+EGFR   Hemoglobin A1c   Vitamin D, 25-hydroxy   Vitamin [id number]   Follicle stimulating hormone   Luteinizing hormone   Magnesium   Ambulatory referral to Gynecology   Ambulatory referral to Dermatology     Requested Prescriptions   Signed Prescriptions Disp Refills   amLODipine (NORVASC) 5 MG tablet 30 tablet 2    Sig: Take 1 tablet (5 mg total) by mouth daily.   citalopram (CELEXA) 20 MG tablet 30 tablet 2    Sig: Take 1 tablet (20 mg total) by mouth daily.    Return in about 1 year (around 10/15/2023) for Physical per patient preference.  Camillia Herter, NP

## 2022-10-15 ENCOUNTER — Encounter: Payer: Self-pay | Admitting: Family

## 2022-10-15 ENCOUNTER — Ambulatory Visit (INDEPENDENT_AMBULATORY_CARE_PROVIDER_SITE_OTHER): Payer: Medicaid Other | Admitting: Family

## 2022-10-15 VITALS — BP 131/83 | HR 103 | Temp 98.3°F | Resp 16 | Ht 62.52 in | Wt 145.0 lb

## 2022-10-15 DIAGNOSIS — N840 Polyp of corpus uteri: Secondary | ICD-10-CM | POA: Diagnosis not present

## 2022-10-15 DIAGNOSIS — Z131 Encounter for screening for diabetes mellitus: Secondary | ICD-10-CM | POA: Diagnosis not present

## 2022-10-15 DIAGNOSIS — N926 Irregular menstruation, unspecified: Secondary | ICD-10-CM

## 2022-10-15 DIAGNOSIS — Z0001 Encounter for general adult medical examination with abnormal findings: Secondary | ICD-10-CM | POA: Diagnosis not present

## 2022-10-15 DIAGNOSIS — I10 Essential (primary) hypertension: Secondary | ICD-10-CM | POA: Diagnosis not present

## 2022-10-15 DIAGNOSIS — Z130 Encounter for screening for diseases of the blood and blood-forming organs and certain disorders involving the immune mechanism: Secondary | ICD-10-CM | POA: Diagnosis not present

## 2022-10-15 DIAGNOSIS — F411 Generalized anxiety disorder: Secondary | ICD-10-CM | POA: Diagnosis not present

## 2022-10-15 DIAGNOSIS — Z13220 Encounter for screening for lipoid disorders: Secondary | ICD-10-CM

## 2022-10-15 DIAGNOSIS — L989 Disorder of the skin and subcutaneous tissue, unspecified: Secondary | ICD-10-CM

## 2022-10-15 DIAGNOSIS — Z1329 Encounter for screening for other suspected endocrine disorder: Secondary | ICD-10-CM | POA: Diagnosis not present

## 2022-10-15 DIAGNOSIS — Z1321 Encounter for screening for nutritional disorder: Secondary | ICD-10-CM

## 2022-10-15 DIAGNOSIS — Z13228 Encounter for screening for other metabolic disorders: Secondary | ICD-10-CM

## 2022-10-15 DIAGNOSIS — Z8632 Personal history of gestational diabetes: Secondary | ICD-10-CM | POA: Diagnosis not present

## 2022-10-15 DIAGNOSIS — Z0000 Encounter for general adult medical examination without abnormal findings: Secondary | ICD-10-CM

## 2022-10-15 DIAGNOSIS — Z0100 Encounter for examination of eyes and vision without abnormal findings: Secondary | ICD-10-CM

## 2022-10-15 DIAGNOSIS — Z1231 Encounter for screening mammogram for malignant neoplasm of breast: Secondary | ICD-10-CM

## 2022-10-15 MED ORDER — AMLODIPINE BESYLATE 5 MG PO TABS: 5.0000 mg | ORAL_TABLET | Freq: Every day | ORAL | 2 refills | Status: DC

## 2022-10-15 MED ORDER — CITALOPRAM HYDROBROMIDE 20 MG PO TABS: 20.0000 mg | ORAL_TABLET | Freq: Every day | ORAL | 2 refills | Status: DC

## 2022-10-15 NOTE — Progress Notes (Signed)
.  Pt presents for annual physical exam  

## 2022-10-15 NOTE — Patient Instructions (Signed)
Preventive Care 40-43 Years Old, Female Preventive care refers to lifestyle choices and visits with your health care provider that can promote health and wellness. Preventive care visits are also called wellness exams. What can I expect for my preventive care visit? Counseling Your health care provider may ask you questions about your: Medical history, including: Past medical problems. Family medical history. Pregnancy history. Current health, including: Menstrual cycle. Method of birth control. Emotional well-being. Home life and relationship well-being. Sexual activity and sexual health. Lifestyle, including: Alcohol, nicotine or tobacco, and drug use. Access to firearms. Diet, exercise, and sleep habits. Work and work environment. Sunscreen use. Safety issues such as seatbelt and bike helmet use. Physical exam Your health care provider will check your: Height and weight. These may be used to calculate your BMI (body mass index). BMI is a measurement that tells if you are at a healthy weight. Waist circumference. This measures the distance around your waistline. This measurement also tells if you are at a healthy weight and may help predict your risk of certain diseases, such as type 2 diabetes and high blood pressure. Heart rate and blood pressure. Body temperature. Skin for abnormal spots. What immunizations do I need?  Vaccines are usually given at various ages, according to a schedule. Your health care provider will recommend vaccines for you based on your age, medical history, and lifestyle or other factors, such as travel or where you work. What tests do I need? Screening Your health care provider may recommend screening tests for certain conditions. This may include: Lipid and cholesterol levels. Diabetes screening. This is done by checking your blood sugar (glucose) after you have not eaten for a while (fasting). Pelvic exam and Pap test. Hepatitis B test. Hepatitis C  test. HIV (human immunodeficiency virus) test. STI (sexually transmitted infection) testing, if you are at risk. Lung cancer screening. Colorectal cancer screening. Mammogram. Talk with your health care provider about when you should start having regular mammograms. This may depend on whether you have a family history of breast cancer. BRCA-related cancer screening. This may be done if you have a family history of breast, ovarian, tubal, or peritoneal cancers. Bone density scan. This is done to screen for osteoporosis. Talk with your health care provider about your test results, treatment options, and if necessary, the need for more tests. Follow these instructions at home: Eating and drinking  Eat a diet that includes fresh fruits and vegetables, whole grains, lean protein, and low-fat dairy products. Take vitamin and mineral supplements as recommended by your health care provider. Do not drink alcohol if: Your health care provider tells you not to drink. You are pregnant, may be pregnant, or are planning to become pregnant. If you drink alcohol: Limit how much you have to 0-1 drink a day. Know how much alcohol is in your drink. In the [place] one drink equals one 12 oz bottle of beer (355 mL), one 5 oz glass of wine (148 mL), or one 1 oz glass of hard liquor (44 mL). Lifestyle Brush your teeth every morning and night with fluoride toothpaste. Floss one time each day. Exercise for at least 30 minutes 5 or more days each week. Do not use any products that contain nicotine or tobacco. These products include cigarettes, chewing tobacco, and vaping devices, such as e-cigarettes. If you need help quitting, ask your health care provider. Do not use drugs. If you are sexually active, practice safe sex. Use a condom or other form of protection to   prevent STIs. If you do not wish to become pregnant, use a form of birth control. If you plan to become pregnant, see your health care provider for a  prepregnancy visit. Take aspirin only as told by your health care provider. Make sure that you understand how much to take and what form to take. Work with your health care provider to find out whether it is safe and beneficial for you to take aspirin daily. Find healthy ways to manage stress, such as: Meditation, yoga, or listening to music. Journaling. Talking to a trusted person. Spending time with friends and family. Minimize exposure to UV radiation to reduce your risk of skin cancer. Safety Always wear your seat belt while driving or riding in a vehicle. Do not drive: If you have been drinking alcohol. Do not ride with someone who has been drinking. When you are tired or distracted. While texting. If you have been using any mind-altering substances or drugs. Wear a helmet and other protective equipment during sports activities. If you have firearms in your house, make sure you follow all gun safety procedures. Seek help if you have been physically or sexually abused. What's next? Visit your health care provider once a year for an annual wellness visit. Ask your health care provider how often you should have your eyes and teeth checked. Stay up to date on all vaccines. This information is not intended to replace advice given to you by your health care provider. Make sure you discuss any questions you have with your health care provider. Document Revised: 01/25/2021 Document Reviewed: 01/25/2021 Elsevier Patient Education  2023 [employer].  

## 2022-10-16 LAB — MICROALBUMIN / CREATININE URINE RATIO
Creatinine, Urine: 162.6 mg/dL
Microalb/Creat Ratio: 8 mg/g creat (ref 0–29)
Microalbumin, Urine: 12.7 ug/mL

## 2022-10-17 LAB — LIPID PANEL
Chol/HDL Ratio: 3.5 ratio (ref 0.0–4.4)
Cholesterol, Total: 176 mg/dL (ref 100–199)
HDL: 51 mg/dL (ref >39)
LDL Chol Calc (NIH): 105 mg/dL — ABNORMAL HIGH (ref 0–99)
Triglycerides: 112 mg/dL (ref 0–149)
VLDL Cholesterol Cal: 20 mg/dL (ref 5–40)

## 2022-10-17 LAB — VITAMIN D 25 HYDROXY (VIT D DEFICIENCY, FRACTURES): Vit D, 25-Hydroxy: 32.6 ng/mL (ref 30.0–100.0)

## 2022-10-17 LAB — CMP14+EGFR
ALT: 12 IU/L (ref 0–32)
AST: 16 IU/L (ref 0–40)
Albumin/Globulin Ratio: 1.9 (ref 1.2–2.2)
Albumin: 4.4 g/dL (ref 3.9–4.9)
Alkaline Phosphatase: 77 IU/L (ref 44–121)
BUN/Creatinine Ratio: 17 (ref 9–23)
BUN: 12 mg/dL (ref 6–24)
Bilirubin Total: <0.2 mg/dL (ref 0.0–1.2)
CO2: 20 mmol/L (ref 20–29)
Calcium: 9.3 mg/dL (ref 8.7–10.2)
Chloride: 101 mmol/L (ref 96–106)
Creatinine, Ser: 0.72 mg/dL (ref 0.57–1.00)
Globulin, Total: 2.3 g/dL (ref 1.5–4.5)
Glucose: 99 mg/dL (ref 70–99)
Potassium: 4.5 mmol/L (ref 3.5–5.2)
Sodium: 140 mmol/L (ref 134–144)
Total Protein: 6.7 g/dL (ref 6.0–8.5)
eGFR: 107 mL/min/{1.73_m2} (ref >59)

## 2022-10-17 LAB — LUTEINIZING HORMONE: LH: 21.5 m[IU]/mL

## 2022-10-17 LAB — CBC
Hematocrit: 41.9 % (ref 34.0–46.6)
Hemoglobin: 13.3 g/dL (ref 11.1–15.9)
MCH: 28.9 pg (ref 26.6–33.0)
MCHC: 31.7 g/dL (ref 31.5–35.7)
MCV: 91 fL (ref 79–97)
Platelets: 365 10*3/uL (ref 150–450)
RBC: 4.6 x10E6/uL (ref 3.77–5.28)
RDW: 12.5 % (ref 11.7–15.4)
WBC: 8.1 10*3/uL (ref 3.4–10.8)

## 2022-10-17 LAB — TSH: TSH: 1.06 u[IU]/mL (ref 0.450–4.500)

## 2022-10-17 LAB — FOLLICLE STIMULATING HORMONE: FSH: 26.8 m[IU]/mL

## 2022-10-17 LAB — HEMOGLOBIN A1C
Est. average glucose Bld gHb Est-mCnc: 108 mg/dL
Hgb A1c MFr Bld: 5.4 % (ref 4.8–5.6)

## 2022-10-17 LAB — VITAMIN B12: Vitamin B-12: 331 pg/mL (ref 232–1245)

## 2022-10-30 DIAGNOSIS — Z1231 Encounter for screening mammogram for malignant neoplasm of breast: Secondary | ICD-10-CM

## 2022-11-28 ENCOUNTER — Ambulatory Visit
Admission: RE | Admit: 2022-11-28 | Discharge: 2022-11-28 | Disposition: A | Discharge status: Home / Self Care | Payer: Medicaid Other | Source: Ambulatory Visit | Attending: Family | Admitting: Family

## 2022-11-28 DIAGNOSIS — Z1231 Encounter for screening mammogram for malignant neoplasm of breast: Secondary | ICD-10-CM

## 2023-01-02 NOTE — Progress Notes (Signed)
Erroneous encounter-disregard

## 2023-01-09 ENCOUNTER — Encounter: Payer: Medicaid Other | Admitting: Family

## 2023-01-09 DIAGNOSIS — F411 Generalized anxiety disorder: Secondary | ICD-10-CM

## 2023-01-09 DIAGNOSIS — Z113 Encounter for screening for infections with a predominantly sexual mode of transmission: Secondary | ICD-10-CM

## 2023-01-09 DIAGNOSIS — I10 Essential (primary) hypertension: Secondary | ICD-10-CM

## 2023-01-09 DIAGNOSIS — Z124 Encounter for screening for malignant neoplasm of cervix: Secondary | ICD-10-CM

## 2023-01-14 ENCOUNTER — Encounter: Payer: Self-pay | Admitting: Family Medicine

## 2023-01-14 ENCOUNTER — Ambulatory Visit: Payer: Medicaid Other | Admitting: Family Medicine

## 2023-01-14 ENCOUNTER — Ambulatory Visit: Payer: Medicaid Other | Admitting: Family

## 2023-01-14 VITALS — BP 143/88 | HR 67 | Resp 18 | Ht 62.52 in | Wt 146.0 lb

## 2023-01-14 DIAGNOSIS — N951 Menopausal and female climacteric states: Secondary | ICD-10-CM

## 2023-01-14 DIAGNOSIS — F411 Generalized anxiety disorder: Secondary | ICD-10-CM

## 2023-01-14 DIAGNOSIS — J302 Other seasonal allergic rhinitis: Secondary | ICD-10-CM | POA: Diagnosis not present

## 2023-01-14 DIAGNOSIS — I10 Essential (primary) hypertension: Secondary | ICD-10-CM

## 2023-01-14 DIAGNOSIS — Z7689 Persons encountering health services in other specified circumstances: Secondary | ICD-10-CM | POA: Diagnosis not present

## 2023-01-14 MED ORDER — CITALOPRAM HYDROBROMIDE 20 MG PO TABS
20.0000 mg | ORAL_TABLET | Freq: Every day | ORAL | 2 refills | Status: DC
Start: 2023-01-14 — End: 2023-06-14

## 2023-01-14 MED ORDER — FLUTICASONE PROPIONATE 50 MCG/ACT NA SUSP: 2.0000 | Freq: Every day | NASAL | 6 refills | Status: DC

## 2023-01-14 MED ORDER — AMLODIPINE BESYLATE 5 MG PO TABS: 5.0000 mg | ORAL_TABLET | Freq: Every day | ORAL | 2 refills | Status: DC

## 2023-01-14 MED ORDER — FEXOFENADINE HCL 180 MG PO TABS: 180.0000 mg | ORAL_TABLET | Freq: Every day | ORAL | 3 refills | Status: AC

## 2023-01-14 NOTE — Patient Instructions (Addendum)
Continue taking your amlodipine every day.  I have also included a copy of the DASH diet which is the low-sodium diet that can be very helpful for blood pressure.  I did take a look at your lab work, and your [facility] were possibly a little bit high for right after your period.  We will continue to keep an eye on this as well as your symptoms and follow-up to see how things are going in about a month.

## 2023-01-14 NOTE — Assessment & Plan Note (Signed)
PHQ-9 score of 3, GAD-7 score 5.  Continue Celexa 20 mg daily.  Will continue to monitor.

## 2023-01-14 NOTE — Assessment & Plan Note (Signed)
Trial of Allegra 180 mg daily, fluticasone nasal spray as needed.  We are hopeful that this may alleviate dizziness and headache.

## 2023-01-14 NOTE — Assessment & Plan Note (Signed)
Will continue to monitor.  Most recent LH and FSH slightly elevated for luteal phase.

## 2023-01-14 NOTE — Progress Notes (Signed)
New Patient Office Visit  Subjective    Patient ID: Emily Small, female    DOB: March 24, 1981  Age: 42 y.o. MRN: [id number]  CC:  Chief Complaint  Patient presents with   Establish Care   Dizziness        Headache    HPI Emily Small presents to establish care. Past medical history includes primary hypertension for which she takes amlodipine 5 mg daily, GAD well-controlled on Celexa 20 mg daily.  She did develop gestational diabetes first noted on 09/20/2021 which has resolved.  She took herself off of amlodipine for a little while but experienced increasing blood pressures and restarted amlodipine about 1 week ago. She is concerned that for the past week she has been experiencing headaches and some dizziness.  She experienced minor relief with ibuprofen and moderate relief with Tylenol sinus medication.  She also endorses left ear fullness. Menstrual cycles have changed from 24 days to about 28 days.  She also has very heavy periods, which has been true for several years.  More recently, she is experiencing perimenopausal symptoms like night sweats, hot flashes, irritability, increased anxiety.  She did have LH and FSH drawn at her annual physical on 10/15/2022.  Both were slightly high for the luteal phase which she states she was in (right after her period).  We will continue to monitor her symptoms and lab work.  Outpatient Encounter Medications as of 01/14/2023  Medication Sig   acetaminophen (TYLENOL) 500 MG tablet Take 500 mg by mouth every 6 (six) hours as needed for mild pain, moderate pain or fever.   fexofenadine (ALLEGRA) 180 MG tablet Take 1 tablet (180 mg total) by mouth daily.   fluticasone (FLONASE) 50 MCG/ACT nasal spray Place 2 sprays into both nostrils daily.   ibuprofen (ADVIL) 800 MG tablet Take 800 mg by mouth every 6 (six) hours as needed.   Iron, Ferrous Sulfate, 325 (65 Fe) MG TABS Take 325 mg by mouth daily.   Magnesium 200 MG TABS Take 1 tablet by mouth at  bedtime.   amLODipine (NORVASC) 5 MG tablet Take 1 tablet (5 mg total) by mouth daily.   citalopram (CELEXA) 20 MG tablet Take 1 tablet (20 mg total) by mouth daily.   [DISCONTINUED] amLODipine (NORVASC) 5 MG tablet Take 1 tablet (5 mg total) by mouth daily.   [DISCONTINUED] citalopram (CELEXA) 20 MG tablet Take 1 tablet (20 mg total) by mouth daily.   No facility-administered encounter medications on file as of 01/14/2023.    Past Medical History:  Diagnosis Date   Anxiety     History reviewed. No pertinent surgical history.  Family History  Problem Relation Age of Onset   Heart disease Mother    Hyperlipidemia Mother    Hypertension Father    Diabetes Father    Heart disease Father    Breast cancer Paternal Grandmother     Social History   Socioeconomic History   Marital status: Married    Spouse name: Not on file   Number of children: Not on file   Years of education: Not on file   Highest education level: Not on file  Occupational History   Not on file  Tobacco Use   Smoking status: Former    Types: E-cigarettes    Passive exposure: Never   Smokeless tobacco: Never  Vaping Use   Vaping Use: Never used  Substance and Sexual Activity   Alcohol use: Yes    Alcohol/week: 12.0 standard  drinks of alcohol    Types: 12 Cans of beer per week   Drug use: No   Sexual activity: Yes    Birth control/protection: None  Other Topics Concern   Not on file  Social History Narrative   Not on file   Social Determinants of Health   Financial Resource Strain: Not on file  Food Insecurity: Not on file  Transportation Needs: Not on file  Physical Activity: Not on file  Stress: Not on file  Social Connections: Not on file  Intimate Partner Violence: Not on file    Review of Systems  Constitutional:  Negative for chills, fever and malaise/fatigue.  HENT:  Negative for congestion, hearing loss, sinus pain and sore throat.        Ear fullness  Eyes:  Negative for blurred  vision and double vision.  Respiratory:  Negative for cough and shortness of breath.   Cardiovascular:  Negative for chest pain, palpitations and leg swelling.  Gastrointestinal:  Negative for abdominal pain, constipation, diarrhea and heartburn.  Genitourinary:  Negative for frequency and urgency.  Musculoskeletal:  Negative for myalgias and neck pain.  Neurological:  Positive for dizziness and headaches.  Endo/Heme/Allergies:  Negative for polydipsia.  Psychiatric/Behavioral:  Negative for depression. The patient is not nervous/anxious.     Objective    BP (!) 143/88 (BP Location: Left Arm, Patient Position: Sitting, Cuff Size: Normal)   Pulse 67   Resp 18   Ht 5' 2.52" (1.588 m)   Wt 146 lb (66.2 kg)   SpO2 100%   BMI 26.26 kg/m   Physical Exam Constitutional:      General: She is not in acute distress.    Appearance: Normal appearance. She is not ill-appearing.  HENT:     Head: Normocephalic and atraumatic.     Right Ear: Tympanic membrane, ear canal and external ear normal.     Left Ear: Tympanic membrane, ear canal and external ear normal.     Nose: Nose normal. No congestion or rhinorrhea.     Mouth/Throat:     Mouth: Mucous membranes are moist.     Pharynx: Oropharynx is clear. No oropharyngeal exudate or posterior oropharyngeal erythema.     Comments: Throat cobblestoning, postnasal drip Eyes:     General:        Right eye: No discharge.        Left eye: No discharge.     Extraocular Movements: Extraocular movements intact.     Conjunctiva/sclera: Conjunctivae normal.     Pupils: Pupils are equal, round, and reactive to light.  Cardiovascular:     Rate and Rhythm: Normal rate and regular rhythm.     Pulses: Normal pulses.     Heart sounds: No murmur heard.    No friction rub. No gallop.  Pulmonary:     Effort: Pulmonary effort is normal. No respiratory distress.     Breath sounds: Normal breath sounds. No wheezing, rhonchi or rales.  Musculoskeletal:         General: Normal range of motion.     Cervical back: Normal range of motion.  Skin:    General: Skin is warm and dry.  Neurological:     Mental Status: She is alert and oriented to person, place, and time.     Cranial Nerves: No cranial nerve deficit.     Assessment & Plan:  Encounter to establish care  Seasonal allergies Assessment & Plan: Trial of Allegra 180 mg daily, fluticasone nasal spray  as needed.  We are hopeful that this may alleviate dizziness and headache.  Orders: -     Fexofenadine HCl; Take 1 tablet (180 mg total) by mouth daily.  Dispense: 90 tablet; Refill: 3 -     Fluticasone Propionate; Place 2 sprays into both nostrils daily.  Dispense: 16 g; Refill: 6  Perimenopausal vasomotor symptoms Assessment & Plan: Will continue to monitor.  Most recent LH and FSH slightly elevated for luteal phase.   Essential hypertension Assessment & Plan: BP goal <130/80.  Blood pressure above goal in office.  Patient will follow-up in 1 month after getting back on her regimen of amlodipine 5 mg daily.  At that time, if blood pressure still elevated will either increase amlodipine or add valsartan 80 mg daily.  Recommended routine physical activity and DASH diet in addition to medication compliance.  Patient verbalized understanding and is agreeable to this plan. The 10-year ASCVD risk score (Arnett DK, et al., 2019) is: 0.7%   Orders: -     amLODIPine Besylate; Take 1 tablet (5 mg total) by mouth daily.  Dispense: 30 tablet; Refill: 2  GAD (generalized anxiety disorder) Assessment & Plan: PHQ-9 score of 3, GAD-7 score 5.  Continue Celexa 20 mg daily.  Will continue to monitor.  Orders: -     Citalopram Hydrobromide; Take 1 tablet (20 mg total) by mouth daily.  Dispense: 30 tablet; Refill: 2    Return in about 4 weeks (around 02/11/2023) for follow-up for HTN and new allergy medicines.   Melida Quitter, PA

## 2023-01-14 NOTE — Assessment & Plan Note (Signed)
BP goal <130/80.  Blood pressure above goal in office.  Patient will follow-up in 1 month after getting back on her regimen of amlodipine 5 mg daily.  At that time, if blood pressure still elevated will either increase amlodipine or add valsartan 80 mg daily.  Recommended routine physical activity and DASH diet in addition to medication compliance.  Patient verbalized understanding and is agreeable to this plan. The 10-year ASCVD risk score (Arnett DK, et al., 2019) is: 0.7%

## 2023-01-16 ENCOUNTER — Encounter: Payer: Medicaid Other | Admitting: Obstetrics & Gynecology

## 2023-02-11 ENCOUNTER — Encounter: Payer: Self-pay | Admitting: Family Medicine

## 2023-02-11 ENCOUNTER — Ambulatory Visit: Payer: Medicaid Other | Admitting: Family Medicine

## 2023-02-11 VITALS — BP 147/89 | HR 112 | Resp 18 | Ht 62.52 in | Wt 150.0 lb

## 2023-02-11 DIAGNOSIS — B009 Herpesviral infection, unspecified: Secondary | ICD-10-CM

## 2023-02-11 DIAGNOSIS — I10 Essential (primary) hypertension: Secondary | ICD-10-CM | POA: Diagnosis not present

## 2023-02-11 DIAGNOSIS — J302 Other seasonal allergic rhinitis: Secondary | ICD-10-CM | POA: Diagnosis not present

## 2023-02-11 MED ORDER — VALACYCLOVIR HCL 1 G PO TABS
2000.0000 mg | ORAL_TABLET | Freq: Every day | ORAL | 0 refills | Status: AC
Start: 2023-02-11 — End: 2023-02-12

## 2023-02-11 MED ORDER — VALACYCLOVIR HCL 1 G PO TABS
2000.0000 mg | ORAL_TABLET | Freq: Every day | ORAL | 0 refills | Status: DC
Start: 2023-02-11 — End: 2023-02-11

## 2023-02-11 MED ORDER — AMLODIPINE BESYLATE 10 MG PO TABS
10.0000 mg | ORAL_TABLET | Freq: Every day | ORAL | 11 refills | Status: DC
Start: 2023-02-11 — End: 2023-06-14

## 2023-02-11 NOTE — Progress Notes (Signed)
Established Patient Office Visit  Subjective   Patient ID: Emily Small, female    DOB: 1980/11/24  Age: 42 y.o. MRN: [id number]  Chief Complaint  Patient presents with   Hypertension    HPI Emily Small is a 42 y.o. female presenting today for follow up of hypertension and allergies.  She would also like to discuss possible management options for fever blisters that continue to flare.  She has never been on any suppressive therapy, the only thing that she has tried in the past that has worked to some degree is Abreva cream.  She may be interested in antiviral therapy if it is not taken for a long period of time for flares. Hypertension: Patient here for follow-up of elevated blood pressure.  Pt denies chest pain, SOB, dizziness, edema, syncope, fatigue or heart palpitations.  She is back to her regular routine of taking amlodipine nightly before bed, reports excellent compliance with treatment. Denies side effects. Allergies: At last appointment endorsed headaches, left ear fullness.  Trial of Allegra 180 mg daily and fluticasone nasal spray as needed.  Her symptoms have improved dramatically while taking the Allegra, she barely needs to use the nasal spray.  ROS Negative unless otherwise noted in HPI   Objective:     BP (!) 147/89 (BP Location: Left Arm, Patient Position: Sitting, Cuff Size: Normal)   Pulse (!) 112   Resp 18   Ht 5' 2.52" (1.588 m)   Wt 150 lb (68 kg)   SpO2 99%   BMI 26.98 kg/m   Physical Exam Constitutional:      General: She is not in acute distress.    Appearance: Normal appearance.  HENT:     Head: Normocephalic and atraumatic.  Cardiovascular:     Rate and Rhythm: Normal rate and regular rhythm.     Heart sounds: No murmur heard.    No friction rub. No gallop.  Pulmonary:     Effort: Pulmonary effort is normal. No respiratory distress.     Breath sounds: No wheezing, rhonchi or rales.  Skin:    General: Skin is warm and dry.  Neurological:      Mental Status: She is alert and oriented to person, place, and time.      Assessment & Plan:  Essential hypertension Assessment & Plan: BP goal <140/90.  Blood pressure above goal.  We discussed increasing amlodipine versus adding valsartan 80 mg daily.  Patient would prefer to increase amlodipine since she is already taking it and tolerating well.  Increase to amlodipine 10 mg daily, return for nurse visit blood pressure check in 2 weeks.  Orders: -     amLODIPine Besylate; Take 1 tablet (10 mg total) by mouth daily.  Dispense: 30 tablet; Refill: 11  Seasonal allergies Assessment & Plan: Continue Allegra 100 mg daily and fluticasone nasal spray as needed.   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  Provided enough for 5 flares.  If needed in the future, we may consider suppressive therapy at valacyclovir 500 mg daily.  Orders: -     valACYclovir HCl; Take 2 tablets (2,000 mg total) by mouth daily for 1 day. As needed for cold sore flares.  Dispense: 10 tablet; Refill: 0    Return in about 4 months (around 06/14/2023) for follow-up for HTN and mood.    Melida Quitter,  PA

## 2023-02-11 NOTE — Assessment & Plan Note (Signed)
Continue Allegra 100 mg daily and fluticasone nasal spray as needed.

## 2023-02-11 NOTE — Assessment & Plan Note (Signed)
Starting trial of valacyclovir 2 g twice daily for 1 day for acute flares of HSV.  Provided enough for 5 flares.  If needed in the future, we may consider suppressive therapy at valacyclovir 500 mg daily.

## 2023-02-11 NOTE — Assessment & Plan Note (Signed)
BP goal <140/90.  Blood pressure above goal.  We discussed increasing amlodipine versus adding valsartan 80 mg daily.  Patient would prefer to increase amlodipine since she is already taking it and tolerating well.  Increase to amlodipine 10 mg daily, return for nurse visit blood pressure check in 2 weeks.

## 2023-02-25 ENCOUNTER — Ambulatory Visit: Payer: Medicaid Other

## 2023-02-28 ENCOUNTER — Ambulatory Visit (INDEPENDENT_AMBULATORY_CARE_PROVIDER_SITE_OTHER): Payer: Medicaid Other | Admitting: Family Medicine

## 2023-02-28 VITALS — BP 137/83 | HR 98 | Ht 62.52 in | Wt 150.0 lb

## 2023-02-28 DIAGNOSIS — I10 Essential (primary) hypertension: Secondary | ICD-10-CM

## 2023-02-28 NOTE — Progress Notes (Signed)
Patient is here for a recheck of her blood pressure

## 2023-03-20 ENCOUNTER — Encounter: Payer: Self-pay | Admitting: Family Medicine

## 2023-06-14 ENCOUNTER — Encounter: Payer: Self-pay | Admitting: Family Medicine

## 2023-06-14 ENCOUNTER — Ambulatory Visit: Payer: Medicaid Other | Admitting: Family Medicine

## 2023-06-14 VITALS — BP 123/79 | HR 88 | Resp 20 | Ht 62.52 in | Wt 146.0 lb

## 2023-06-14 DIAGNOSIS — I10 Essential (primary) hypertension: Secondary | ICD-10-CM | POA: Diagnosis not present

## 2023-06-14 DIAGNOSIS — E611 Iron deficiency: Secondary | ICD-10-CM | POA: Diagnosis not present

## 2023-06-14 DIAGNOSIS — D229 Melanocytic nevi, unspecified: Secondary | ICD-10-CM

## 2023-06-14 DIAGNOSIS — F411 Generalized anxiety disorder: Secondary | ICD-10-CM | POA: Diagnosis not present

## 2023-06-14 DIAGNOSIS — Z136 Encounter for screening for cardiovascular disorders: Secondary | ICD-10-CM

## 2023-06-14 DIAGNOSIS — E559 Vitamin D deficiency, unspecified: Secondary | ICD-10-CM | POA: Diagnosis not present

## 2023-06-14 DIAGNOSIS — Z131 Encounter for screening for diabetes mellitus: Secondary | ICD-10-CM | POA: Diagnosis not present

## 2023-06-14 DIAGNOSIS — Z23 Encounter for immunization: Secondary | ICD-10-CM | POA: Diagnosis not present

## 2023-06-14 DIAGNOSIS — Z1329 Encounter for screening for other suspected endocrine disorder: Secondary | ICD-10-CM

## 2023-06-14 MED ORDER — CITALOPRAM HYDROBROMIDE 20 MG PO TABS
20.0000 mg | ORAL_TABLET | Freq: Every day | ORAL | 1 refills | Status: DC
Start: 2023-06-14 — End: 2024-02-17

## 2023-06-14 MED ORDER — AMLODIPINE BESYLATE 10 MG PO TABS: 10.0000 mg | ORAL_TABLET | Freq: Every day | ORAL | 3 refills | Status: DC

## 2023-06-14 NOTE — Patient Instructions (Addendum)
Let me know if you do want to adjust your dose of Celexa at all!  We can even increase the dose consistently throughout the month, or if your cycle becomes more predictable, we could increase it just for the week prior to period.   Magnesium glycinate supplement: https://a.co/d/[email or web address] You can take this at bedtime 20-60 minutes before you would like to fall asleep.  It is safe to take at the same time as iron or a multivitamin.

## 2023-06-14 NOTE — Progress Notes (Signed)
Established Patient Office Visit  Subjective   Patient ID: Emily Small, female    DOB: [birth year]  Age: 42 y.o. MRN: [id number]  Chief Complaint  Patient presents with   Hypertension   Anxiety   Depression    HPI Emily Small is a 42 y.o. female presenting today for follow up of hypertension, mood.  She would also like a referral to [facility] to re-establish care for annual skin checks. Hypertension:  Pt denies chest pain, SOB, dizziness, edema, syncope, fatigue or heart palpitations. Taking amlodipine, reports excellent compliance with treatment. Denies side effects. Mood: Patient is here to follow up for anxiety, currently managing with Celexa 20 mg daily. Taking medication without side effects, reports excellent compliance with treatment. Denies mood changes or SI/HI. She feels mood is stable since last visit, but she does notice that she experiences increased irritability the week prior to menstruation.  Other than that week, the rest of the month her current dose of Celexa works very well.     06/14/2023    9:00 AM 02/11/2023   10:53 AM 01/14/2023   10:03 AM  Depression screen PHQ 2/9  Decreased Interest 0 0 0  Down, Depressed, Hopeless 0 0 0  PHQ - 2 Score 0 0 0  Altered sleeping 2 0 2  Tired, decreased energy 1 1 1   Change in appetite 0 1 0  Feeling bad or failure about yourself  0 0 0  Trouble concentrating 0 0 0  Moving slowly or fidgety/restless 0 0 0  Suicidal thoughts 0 0 0  PHQ-9 Score 3 2 3   Difficult doing work/chores Not difficult at all Not difficult at all Not difficult at all       06/14/2023    9:00 AM 02/11/2023   10:53 AM 01/14/2023   10:03 AM 09/14/2022    4:05 PM  GAD 7 : Generalized Anxiety Score  Nervous, Anxious, on Edge 1 1 1  0  Control/stop worrying 1 0 0 0  Worry too much - different things 1 0 1 0  Trouble relaxing 1 0 1 0  Restless 0 0 0 0  Easily annoyed or irritable 2 1 2 1   Afraid - awful might happen 1 0 0 0  Total GAD 7  Score 7 2 5 1   Anxiety Difficulty Somewhat difficult Not difficult at all Not difficult at all    Outpatient Medications Prior to Visit  Medication Sig   acetaminophen (TYLENOL) 500 MG tablet Take 500 mg by mouth every 6 (six) hours as needed for mild pain, moderate pain or fever.   fexofenadine (ALLEGRA) 180 MG tablet Take 1 tablet (180 mg total) by mouth daily.   fluticasone (FLONASE) 50 MCG/ACT nasal spray Place 2 sprays into both nostrils daily.   ibuprofen (ADVIL) 800 MG tablet Take 800 mg by mouth every 6 (six) hours as needed.   Iron, Ferrous Sulfate, 325 (65 Fe) MG TABS Take 325 mg by mouth daily.   Magnesium 200 MG TABS Take 1 tablet by mouth at bedtime.   [DISCONTINUED] amLODipine (NORVASC) 10 MG tablet Take 1 tablet (10 mg total) by mouth daily.   [DISCONTINUED] citalopram (CELEXA) 20 MG tablet Take 1 tablet (20 mg total) by mouth daily.   No facility-administered medications prior to visit.    ROS Negative unless otherwise noted in HPI   Objective:     BP 123/79 (BP Location: Left Arm, Patient Position: Sitting, Cuff Size: Normal)   Pulse 88  Resp 20   Ht 5' 2.52" (1.588 m)   Wt 146 lb (66.2 kg)   SpO2 99%   BMI 26.26 kg/m   Physical Exam Constitutional:      General: She is not in acute distress.    Appearance: Normal appearance.  HENT:     Head: Normocephalic and atraumatic.  Cardiovascular:     Rate and Rhythm: Normal rate and regular rhythm.     Heart sounds: No murmur heard.    No friction rub. No gallop.  Pulmonary:     Effort: Pulmonary effort is normal. No respiratory distress.     Breath sounds: No wheezing, rhonchi or rales.  Musculoskeletal:     Cervical back: Normal range of motion.  Skin:    General: Skin is warm and dry.  Neurological:     General: No focal deficit present.     Mental Status: She is alert and oriented to person, place, and time. Mental status is at baseline.  Psychiatric:        Mood and Affect: Mood normal.         Thought Content: Thought content normal.        Judgment: Judgment normal.     Assessment & Plan:  Essential hypertension Assessment & Plan: BP goal <140/90.  Continue amlodipine 10 mg daily.  Most recent CMP within normal limits.  Orders: -     CBC with Differential/Platelet; Future -     Comprehensive metabolic panel; Future -     amLODIPine Besylate; Take 1 tablet (10 mg total) by mouth daily.  Dispense: 90 tablet; Refill: 3  GAD (generalized anxiety disorder) Assessment & Plan: Stable overall.  Continue Celexa 20 mg daily.  Discussed option to increase Celexa to 30 mg daily or 40 mg daily either continuously or just for the week prior to menstruation.  Patient does not feel that this is necessary right now but will consider it if needed in the future.  Will continue to monitor.  Orders: -     CBC with Differential/Platelet; Future -     Comprehensive metabolic panel; Future -     Citalopram Hydrobromide; Take 1 tablet (20 mg total) by mouth daily.  Dispense: 90 tablet; Refill: 1  Need for influenza vaccination -     Flu vaccine trivalent PF, 6mos and older(Flulaval,Afluria,Fluarix,Fluzone)  Multiple nevi -     Ambulatory referral to Dermatology  Dermatology referral made as requested.  Return in about 6 months (around 12/12/2023) for annual physical, fasting blood work 1 week before.    Melida Quitter, PA

## 2023-06-14 NOTE — Assessment & Plan Note (Addendum)
BP goal <140/90.  Continue amlodipine 10 mg daily.  Most recent CMP within normal limits.

## 2023-06-14 NOTE — Assessment & Plan Note (Addendum)
Stable overall.  Continue Celexa 20 mg daily.  Discussed option to increase Celexa to 30 mg daily or 40 mg daily either continuously or just for the week prior to menstruation.  Patient does not feel that this is necessary right now but will consider it if needed in the future.  Will continue to monitor.

## 2023-07-02 ENCOUNTER — Encounter: Payer: Self-pay | Admitting: Family Medicine

## 2023-08-01 ENCOUNTER — Ambulatory Visit: Payer: Medicaid Other | Admitting: Family Medicine

## 2023-08-01 ENCOUNTER — Encounter: Payer: Self-pay | Admitting: Family Medicine

## 2023-08-01 VITALS — BP 137/81 | HR 97 | Resp 18 | Ht 62.52 in | Wt 148.0 lb

## 2023-08-01 DIAGNOSIS — R079 Chest pain, unspecified: Secondary | ICD-10-CM | POA: Diagnosis not present

## 2023-08-01 NOTE — Progress Notes (Signed)
   Acute Office Visit  Subjective:     Patient ID: Emily Small, female    DOB: 1981/08/01, 42 y.o.   MRN: [id number]  Chief Complaint  Patient presents with   Chest Pain    HPI Patient is in today for chest pain.  Chest pain is described as "soreness" and has been occurring intermittently for about 1 week.  It tends to be worse when she is driving, washing dishes, or using her arms.  It is also worse when she first wakes up in the morning compared to later in the day.  Alleviating factors include chest/arm stretches that she does on the wall.  The pain has been coming off and on.  History of hypertension and anxiety, family history of heart disease.  ROS See HPI    Objective:    BP 137/81 (BP Location: Left Arm, Patient Position: Sitting, Cuff Size: Normal)   Pulse 97   Resp 18   Ht 5' 2.52" (1.588 m)   Wt 148 lb (67.1 kg)   SpO2 99%   BMI 26.62 kg/m   Physical Exam Constitutional:      General: She is not in acute distress.    Appearance: Normal appearance.  HENT:     Head: Normocephalic and atraumatic.  Cardiovascular:     Rate and Rhythm: Normal rate and regular rhythm.     Heart sounds: No murmur heard.    No friction rub. No gallop.  Pulmonary:     Effort: Pulmonary effort is normal. No respiratory distress.     Breath sounds: Normal breath sounds. No wheezing, rhonchi or rales.  Chest:     Chest wall: No deformity or tenderness.  Skin:    General: Skin is warm and dry.  Neurological:     Mental Status: She is alert and oriented to person, place, and time.     Cranial Nerves: No cranial nerve deficit.  Psychiatric:        Mood and Affect: Mood is anxious.    EKG: unchanged from previous tracings, sinus tachycardia.    Assessment & Plan:  Chest pain, unspecified type -     EKG 12-Lead  EKG unchanged, sinus tachycardia but otherwise sinus rhythm.  Chest pain likely costochondritis, discussed that this is musculoskeletal in nature.  Continue gentle  stretching, may use ice/heat, acetaminophen or ibuprofen for additional pain relief.  Return if symptoms worsen or fail to improve.  Melida Quitter, PA

## 2023-08-08 DIAGNOSIS — L700 Acne vulgaris: Secondary | ICD-10-CM | POA: Diagnosis not present

## 2023-08-08 DIAGNOSIS — D2372 Other benign neoplasm of skin of left lower limb, including hip: Secondary | ICD-10-CM | POA: Diagnosis not present

## 2023-08-08 DIAGNOSIS — D2371 Other benign neoplasm of skin of right lower limb, including hip: Secondary | ICD-10-CM | POA: Diagnosis not present

## 2023-08-08 DIAGNOSIS — D225 Melanocytic nevi of trunk: Secondary | ICD-10-CM | POA: Diagnosis not present

## 2023-09-12 ENCOUNTER — Ambulatory Visit: Payer: Self-pay | Admitting: Family Medicine

## 2023-09-12 NOTE — Telephone Encounter (Signed)
Reason for Disposition  Earache  Answer Assessment - Initial Assessment Questions 1. WORST SYMPTOM: "What is your worst symptom?" (e.g., cough, runny nose, muscle aches, headache, sore throat, fever)      Chills, fever, headache, cough, ears, itchy, body aches feel the worst, congestion  2. ONSET: "When did your flu symptoms start?"      Last night      3. RESPIRATORY DISTRESS: "Describe your breathing."      Normal  4. FEVER: "Do you have a fever?" If Yes, ask: "What is your temperature, how was it measured, and when did it start?"     Feels like has a fever  5. EXPOSURE: "Were you exposed to someone with influenza?"       Both sons have flu  Protocols used: Influenza (Flu) - Seasonal-A-AH   Patient stated that both her sons have the flu. Patient began having symptoms yesterday. Patient asking about need for prescription. Appointment scheduled for 1/31.

## 2023-09-13 ENCOUNTER — Telehealth: Payer: Self-pay | Admitting: *Deleted

## 2023-09-13 ENCOUNTER — Encounter: Payer: Self-pay | Admitting: Family

## 2023-09-13 NOTE — Progress Notes (Signed)
 Erroneous encounter-disregard

## 2023-09-13 NOTE — Telephone Encounter (Signed)
She does not need a prescription, but I would recommend Tylenol 500 mg every 4-6 hours for fever, headache, body aches.  She can use regular Mucinex (not Mucinex DM) to help with her congestion, and increase her hydration aiming for at least 120 ounces of fluid intake each day.

## 2023-09-13 NOTE — Telephone Encounter (Signed)
Sent mychart message to pt about recommendations pertaining to the call that came in earlier.

## 2023-09-13 NOTE — Telephone Encounter (Signed)
Will send mychart message with provider recommendations

## 2023-09-19 ENCOUNTER — Encounter: Payer: Self-pay | Admitting: Family Medicine

## 2023-11-04 ENCOUNTER — Other Ambulatory Visit: Payer: Self-pay | Admitting: Family Medicine

## 2023-11-04 DIAGNOSIS — Z0000 Encounter for general adult medical examination without abnormal findings: Secondary | ICD-10-CM

## 2023-12-02 ENCOUNTER — Ambulatory Visit
Admission: RE | Admit: 2023-12-02 | Discharge: 2023-12-02 | Disposition: A | Discharge status: Home / Self Care | Source: Ambulatory Visit | Attending: Family Medicine | Admitting: Family Medicine

## 2023-12-02 DIAGNOSIS — Z1231 Encounter for screening mammogram for malignant neoplasm of breast: Secondary | ICD-10-CM | POA: Diagnosis not present

## 2023-12-02 DIAGNOSIS — Z0000 Encounter for general adult medical examination without abnormal findings: Secondary | ICD-10-CM

## 2023-12-05 ENCOUNTER — Other Ambulatory Visit: Payer: Medicaid Other

## 2023-12-05 DIAGNOSIS — E559 Vitamin D deficiency, unspecified: Secondary | ICD-10-CM

## 2023-12-05 DIAGNOSIS — Z131 Encounter for screening for diabetes mellitus: Secondary | ICD-10-CM

## 2023-12-05 DIAGNOSIS — F411 Generalized anxiety disorder: Secondary | ICD-10-CM

## 2023-12-05 DIAGNOSIS — I10 Essential (primary) hypertension: Secondary | ICD-10-CM

## 2023-12-05 DIAGNOSIS — E611 Iron deficiency: Secondary | ICD-10-CM

## 2023-12-05 DIAGNOSIS — Z136 Encounter for screening for cardiovascular disorders: Secondary | ICD-10-CM

## 2023-12-05 DIAGNOSIS — Z1329 Encounter for screening for other suspected endocrine disorder: Secondary | ICD-10-CM

## 2023-12-10 ENCOUNTER — Other Ambulatory Visit

## 2023-12-11 LAB — CBC WITH DIFFERENTIAL/PLATELET
Basophils Absolute: 0.1 10*3/uL (ref 0.0–0.2)
Basos: 2 %
EOS (ABSOLUTE): 0.1 10*3/uL (ref 0.0–0.4)
Eos: 3 %
Hematocrit: 33.7 % — ABNORMAL LOW (ref 34.0–46.6)
Hemoglobin: 10.1 g/dL — ABNORMAL LOW (ref 11.1–15.9)
Immature Grans (Abs): 0 10*3/uL (ref 0.0–0.1)
Immature Granulocytes: 0 %
Lymphocytes Absolute: 1.3 10*3/uL (ref 0.7–3.1)
Lymphs: 26 %
MCH: 23.1 pg — ABNORMAL LOW (ref 26.6–33.0)
MCHC: 30 g/dL — ABNORMAL LOW (ref 31.5–35.7)
MCV: 77 fL — ABNORMAL LOW (ref 79–97)
Monocytes Absolute: 0.3 10*3/uL (ref 0.1–0.9)
Monocytes: 7 %
Neutrophils Absolute: 3 10*3/uL (ref 1.4–7.0)
Neutrophils: 62 %
Platelets: 412 10*3/uL (ref 150–450)
RBC: 4.38 x10E6/uL (ref 3.77–5.28)
RDW: 15.8 % — ABNORMAL HIGH (ref 11.7–15.4)
WBC: 4.9 10*3/uL (ref 3.4–10.8)

## 2023-12-11 LAB — LIPID PANEL
Chol/HDL Ratio: 3.4 ratio (ref 0.0–4.4)
Cholesterol, Total: 169 mg/dL (ref 100–199)
HDL: 49 mg/dL (ref >39)
LDL Chol Calc (NIH): 86 mg/dL (ref 0–99)
Triglycerides: 201 mg/dL — ABNORMAL HIGH (ref 0–149)
VLDL Cholesterol Cal: 34 mg/dL (ref 5–40)

## 2023-12-11 LAB — HEMOGLOBIN A1C
Est. average glucose Bld gHb Est-mCnc: 88 mg/dL
Hgb A1c MFr Bld: 4.7 % — ABNORMAL LOW (ref 4.8–5.6)

## 2023-12-11 LAB — FERRITIN: Ferritin: 13 ng/mL — ABNORMAL LOW (ref 15–150)

## 2023-12-11 LAB — COMPREHENSIVE METABOLIC PANEL WITH GFR
ALT: 12 IU/L (ref 0–32)
AST: 17 IU/L (ref 0–40)
Albumin: 4.8 g/dL (ref 3.9–4.9)
Alkaline Phosphatase: 60 IU/L (ref 44–121)
BUN/Creatinine Ratio: 17 (ref 9–23)
BUN: 12 mg/dL (ref 6–24)
Bilirubin Total: <0.2 mg/dL (ref 0.0–1.2)
CO2: 21 mmol/L (ref 20–29)
Calcium: 9.6 mg/dL (ref 8.7–10.2)
Chloride: 100 mmol/L (ref 96–106)
Creatinine, Ser: 0.72 mg/dL (ref 0.57–1.00)
Globulin, Total: 2.1 g/dL (ref 1.5–4.5)
Glucose: 107 mg/dL — ABNORMAL HIGH (ref 70–99)
Potassium: 4.2 mmol/L (ref 3.5–5.2)
Sodium: 138 mmol/L (ref 134–144)
Total Protein: 6.9 g/dL (ref 6.0–8.5)
eGFR: 106 mL/min/{1.73_m2} (ref >59)

## 2023-12-11 LAB — IRON AND TIBC
Iron Saturation: 2 % — CL (ref 15–55)
Iron: 12 ug/dL — ABNORMAL LOW (ref 27–159)
Total Iron Binding Capacity: 504 ug/dL — ABNORMAL HIGH (ref 250–450)
UIBC: 492 ug/dL — ABNORMAL HIGH (ref 131–425)

## 2023-12-11 LAB — TSH RFX ON ABNORMAL TO FREE T4: TSH: 1.25 u[IU]/mL (ref 0.450–4.500)

## 2023-12-11 LAB — VITAMIN D 25 HYDROXY (VIT D DEFICIENCY, FRACTURES): Vit D, 25-Hydroxy: 34.8 ng/mL (ref 30.0–100.0)

## 2023-12-12 ENCOUNTER — Encounter: Payer: Medicaid Other | Admitting: Family Medicine

## 2023-12-19 ENCOUNTER — Encounter: Payer: Self-pay | Admitting: Family Medicine

## 2023-12-19 ENCOUNTER — Ambulatory Visit (INDEPENDENT_AMBULATORY_CARE_PROVIDER_SITE_OTHER): Admitting: Family Medicine

## 2023-12-19 VITALS — BP 123/77 | HR 101 | Ht 61.0 in | Wt 144.0 lb

## 2023-12-19 DIAGNOSIS — D500 Iron deficiency anemia secondary to blood loss (chronic): Secondary | ICD-10-CM | POA: Diagnosis not present

## 2023-12-19 DIAGNOSIS — N920 Excessive and frequent menstruation with regular cycle: Secondary | ICD-10-CM

## 2023-12-19 DIAGNOSIS — Z0000 Encounter for general adult medical examination without abnormal findings: Secondary | ICD-10-CM

## 2023-12-19 DIAGNOSIS — F1090 Alcohol use, unspecified, uncomplicated: Secondary | ICD-10-CM | POA: Diagnosis not present

## 2023-12-19 DIAGNOSIS — D509 Iron deficiency anemia, unspecified: Secondary | ICD-10-CM | POA: Insufficient documentation

## 2023-12-19 NOTE — Patient Instructions (Signed)
 It was nice to see you today,  We addressed the following topics today: -I am ordering some blood test today to look for potential other causes of your anemia. - You can go back to taking the iron  every day and add vitamin C to this. - I would recommend you talk to your gynecologist about options for birth control including oral contraceptives versus IUD versus Nexplanon to see which is most appropriate in your situation. - If you want to start the bupropion/Wellbutrin at any time just let us  know and I will send it in.  Have a great day,  Etha Henle, MD

## 2023-12-19 NOTE — Assessment & Plan Note (Signed)
 Contributing to her iron  deficiency.  Heavier after taking iron  supplementation.  Not currently on birth control.  And IUDs in the past that significantly decreased her menstrual bleeding.  Vies her to discuss with her gynecologist options including replacing IUD, Nexplanon or oral contraceptives considering she is starting to experience perimenopausal symptoms.

## 2023-12-19 NOTE — Assessment & Plan Note (Signed)
 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  Has never had any withdrawal symptoms in the past.  Is currently starting to look into quitting or reducing.  Discussed bupropion to help with cravings.  She states this has helped with her mother.  Patient would like to hold off on this for now.

## 2023-12-19 NOTE — Progress Notes (Signed)
 Annual physical  Subjective    Patient ID: Emily Small, female    DOB: 08/13/1981  Age: 43 y.o. MRN: [id number]  Chief Complaint  Patient presents with   Annual Exam   HPI Emily Small is a 43 y.o. old female here  for annual exam.   Subjective - Presents for annual physical - Concerned about iron  deficiency anemia - Reports taking iron  supplements daily, feels better with daily dosing - History of severe anemia requiring blood transfusion in 2020 (hemoglobin 3) - Reports heavy menstrual periods with golf ball sized clots, requiring adult diapers at night - Menstrual cycle occurs monthly, lasts 4 days with 2 days of heavy bleeding - Reports symptoms consistent with perimenopause - History of uterine polyp found on ultrasound - Not currently taking any birth control, has had 4 IUDs previously.  Menses much lighter during times of contraception use. 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 Negative for abdominal pain, nausea, vomiting, diarrhea, constipation.   The 10-year ASCVD risk score (Arnett DK, et al., 2019) is: 0.8%  Health Maintenance Due  Topic Date Due   COVID-19 Vaccine (1 - 2024-25 season) Never  done      Objective:     BP 123/77   Pulse (!) 101   Ht 5\' 1"  (1.549 m)   Wt 144 lb (65.3 kg)   LMP 11/11/2023   SpO2 100%   BMI 27.21 kg/m    Physical Exam General: Alert, oriented HEENT: PERRLA, EOMI, moist mucosa CV: Rate regular Pulmonary: Clear bilaterally GI: Soft, normal bowel sounds MSK: Strength are bilaterally Extremities: No pedal edema Psych: Pleasant affect   No results found for any visits on 12/19/23.      Assessment & Plan:   Iron  deficiency anemia due to chronic blood loss Assessment & Plan: Most likely secondary to heavy menstrual bleeding.  Seems that her absorption was better when she was taking it every day versus taking it every other day.  Has only been taking it every other day for a few months.  Never had issues with side effects.  Discussed adding vitamin C.  Would benefit from restarting birth control/IUD to decrease her heavy menses.  Orders: -     CBC -     Reticulocytes -     Lactate dehydrogenase -     Haptoglobin  Menorrhagia with regular cycle Assessment & Plan: Contributing to her iron  deficiency.  Heavier after taking iron  supplementation.  Not currently on birth control.  And IUDs in the past that significantly decreased her menstrual bleeding.  Vies her to discuss with her gynecologist options including replacing IUD, Nexplanon or oral contraceptives considering she is starting to experience perimenopausal symptoms.   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  Has never had any withdrawal symptoms in the past.  Is currently  starting to look into quitting or reducing.  Discussed bupropion to help with cravings.  She states this has helped with her mother.  Patient would like to hold off on this for now.      Return in about 6 months (around 06/20/2024) for HTN, anemia.    Laneta Pintos, MD

## 2023-12-19 NOTE — Assessment & Plan Note (Signed)
 Most likely secondary to heavy menstrual bleeding.  Seems that her absorption was better when she was taking it every day versus taking it every other day.  Has only been taking it every other day for a few months.  Never had issues with side effects.  Discussed adding vitamin C.  Would benefit from restarting birth control/IUD to decrease her heavy menses.

## 2023-12-20 ENCOUNTER — Encounter: Payer: Self-pay | Admitting: Family Medicine

## 2023-12-20 LAB — CBC
Hematocrit: 30.5 % — ABNORMAL LOW (ref 34.0–46.6)
Hemoglobin: 9.3 g/dL — ABNORMAL LOW (ref 11.1–15.9)
MCH: 23.4 pg — ABNORMAL LOW (ref 26.6–33.0)
MCHC: 30.5 g/dL — ABNORMAL LOW (ref 31.5–35.7)
MCV: 77 fL — ABNORMAL LOW (ref 79–97)
Platelets: 431 10*3/uL (ref 150–450)
RBC: 3.98 x10E6/uL (ref 3.77–5.28)
RDW: 15.7 % — ABNORMAL HIGH (ref 11.7–15.4)
WBC: 3.6 10*3/uL (ref 3.4–10.8)

## 2023-12-20 LAB — RETICULOCYTES: Retic Ct Pct: 2.6 % (ref 0.6–2.6)

## 2023-12-20 LAB — HAPTOGLOBIN: Haptoglobin: 199 mg/dL (ref 42–296)

## 2023-12-20 LAB — LACTATE DEHYDROGENASE: LDH: 157 IU/L (ref 119–226)

## 2023-12-24 ENCOUNTER — Other Ambulatory Visit: Payer: Self-pay | Admitting: Family Medicine

## 2023-12-24 DIAGNOSIS — D500 Iron deficiency anemia secondary to blood loss (chronic): Secondary | ICD-10-CM

## 2024-02-11 ENCOUNTER — Other Ambulatory Visit

## 2024-02-11 DIAGNOSIS — D500 Iron deficiency anemia secondary to blood loss (chronic): Secondary | ICD-10-CM

## 2024-02-12 ENCOUNTER — Ambulatory Visit: Payer: Self-pay | Admitting: Family Medicine

## 2024-02-12 LAB — CBC WITH DIFFERENTIAL/PLATELET
Basophils Absolute: 0.1 10*3/uL (ref 0.0–0.2)
Basos: 1 %
EOS (ABSOLUTE): 0.2 10*3/uL (ref 0.0–0.4)
Eos: 3 %
Hematocrit: 43.2 % (ref 34.0–46.6)
Hemoglobin: 13.7 g/dL (ref 11.1–15.9)
Immature Grans (Abs): 0 10*3/uL (ref 0.0–0.1)
Immature Granulocytes: 0 %
Lymphocytes Absolute: 1.7 10*3/uL (ref 0.7–3.1)
Lymphs: 32 %
MCH: 28.1 pg (ref 26.6–33.0)
MCHC: 31.7 g/dL (ref 31.5–35.7)
MCV: 89 fL (ref 79–97)
Monocytes Absolute: 0.3 10*3/uL (ref 0.1–0.9)
Monocytes: 6 %
Neutrophils Absolute: 3.2 10*3/uL (ref 1.4–7.0)
Neutrophils: 58 %
Platelets: 324 10*3/uL (ref 150–450)
RBC: 4.88 x10E6/uL (ref 3.77–5.28)
RDW: 19.4 % — ABNORMAL HIGH (ref 11.7–15.4)
WBC: 5.5 10*3/uL (ref 3.4–10.8)

## 2024-02-15 ENCOUNTER — Other Ambulatory Visit: Payer: Self-pay | Admitting: Family Medicine

## 2024-02-15 DIAGNOSIS — F411 Generalized anxiety disorder: Secondary | ICD-10-CM

## 2024-05-12 ENCOUNTER — Encounter: Payer: Self-pay | Admitting: Obstetrics and Gynecology

## 2024-05-12 ENCOUNTER — Other Ambulatory Visit: Payer: Self-pay | Admitting: Family Medicine

## 2024-05-12 ENCOUNTER — Ambulatory Visit: Admitting: Obstetrics and Gynecology

## 2024-05-12 VITALS — BP 143/89 | HR 82 | Ht 62.0 in | Wt 145.0 lb

## 2024-05-12 DIAGNOSIS — N939 Abnormal uterine and vaginal bleeding, unspecified: Secondary | ICD-10-CM

## 2024-05-12 DIAGNOSIS — F411 Generalized anxiety disorder: Secondary | ICD-10-CM

## 2024-05-12 DIAGNOSIS — J302 Other seasonal allergic rhinitis: Secondary | ICD-10-CM

## 2024-05-12 NOTE — Progress Notes (Signed)
 NEW GYNECOLOGY VISIT Chief Complaint  Patient presents with   New Patient (Initial Visit)     Subjective:  Emily Small is a 43 y.o. [id number] who presents for abnormal uterine bleeding.  Last seen 10/2020. History of symptomatic anemia in 2020 requiring admission and blood transfusion (Hgb at that time was 5.7). She was discharged home on provera . She had a Mirena  IUD in 2021, that subsequently expelled in 2022. Not a candidate for OCP/nuvaring due to HTN, prescribed lysteda  at that time. Has not been seen for follow up since. She does not think she took lysteda . She reports that she continues to have very heavy periods. Finds it very annoying. Doesn't want to have to deal with this anymore  Pelvic ultrasound 2020: IMPRESSION: 1. Echogenic lesion in the fundal endometrium concerning for a polyp. Further evaluation with hysteroscopy or saline infused hysterosonography recommended. 2. Unremarkable ovaries.    Gyn History: No LMP recorded. Last pap:  Lab Results  Component Value Date   DIAGPAP  02/09/2020    - Negative for intraepithelial lesion or malignancy (NILM)   HPVHIGH Negative 02/09/2020  History of abnormal pap: No Periods: see above  OB History     Gravida  4   Para  4   Term  4   Preterm  0   AB  0   Living  4      SAB  0   IAB  0   Ectopic  0   Multiple  0   Live Births  4           Past Medical History:  Diagnosis Date   Anxiety     Past Surgical History:  Procedure Laterality Date   TONSILLECTOMY      Social History   Socioeconomic History   Marital status: Married    Spouse name: Not on file   Number of children: Not on file   Years of education: Not on file   Highest education level: Not on file  Occupational History   Not on file  Tobacco Use   Smoking status: Former    Types: E-cigarettes    Passive exposure: Never   Smokeless tobacco: Never  Vaping Use   Vaping status: Every Day   Substances: Nicotine   Substance and Sexual Activity   Alcohol use: Yes    Alcohol/week: 12.0 standard drinks of alcohol    Types: 12 Cans of beer per week   Drug use: No   Sexual activity: Yes    Birth control/protection: None  Other Topics Concern   Not on file  Social History Narrative   Not on file   Social Drivers of Health   Financial Resource Strain: Not on file  Food Insecurity: Not on file  Transportation Needs: Not on file  Physical Activity: Not on file  Stress: Not on file  Social Connections: Not on file    Family History  Problem Relation Age of Onset   Heart disease Mother    Hyperlipidemia Mother    Hypertension Father    Diabetes Father    Heart disease Father    Breast cancer Paternal Grandmother 50 - 61    Current Outpatient Medications on File Prior to Visit  Medication Sig Dispense Refill   acetaminophen  (TYLENOL ) 500 MG tablet Take 500 mg by mouth every 6 (six) hours as needed for mild pain, moderate pain or fever.     amLODipine  (NORVASC ) 10 MG tablet Take 1 tablet (10 mg  total) by mouth daily. 90 tablet 3   fexofenadine  (ALLEGRA ) 180 MG tablet Take 1 tablet (180 mg total) by mouth daily. 90 tablet 3   ibuprofen  (ADVIL ) 800 MG tablet Take 800 mg by mouth every 6 (six) hours as needed.     Iron , Ferrous Sulfate , 325 (65 Fe) MG TABS Take 325 mg by mouth daily. 30 tablet 1   Magnesium 200 MG TABS Take 1 tablet by mouth at bedtime.     Multiple Vitamin (MULTIVITAMIN) tablet Take 1 tablet by mouth daily.     No current facility-administered medications on file prior to visit.    No Known Allergies   Objective:   Vitals:   05/12/24 1415  BP: (!) 143/89  Pulse: 82  Weight: 145 lb (65.8 kg)  Height: 5' 2 (1.575 m)   Physical Examination:   General appearance - well appearing, and in no distress  Mental status - alert, oriented to person, place, and time  Psych:  normal mood and affect    Assessment and Plan:  1. Abnormal uterine bleeding (Primary) Reviewed  previous workup as well as pelvic ultrasound. Suggestion of possible polyp. Given that it has been 5 years, recommend repeat ultrasound. Also will do labs to screen for anemia given previously significant anemia requiring blood transfusion. We reviewed options for management including medical and surgical. She is uncertain about how she would like to proceed. She has tried and failed multiple medical options previously and also required blood transfusion for symptomatic anemia however she is hesitant about surgery. She will consider her options and return for review of result and management options - CBC - Iron  and TIBC - Ferritin - TSH Rfx on Abnormal to Free T4 - US  PELVIC COMPLETE WITH TRANSVAGINAL; Future    Return for review of results after ultrasound & labs.  Future Appointments  Date Time Provider Department Center  06/22/2024  1:10 PM Chandra Toribio POUR, MD [facility]    Rollo ONEIDA Bring, MD, FACOG Obstetrician & Gynecologist, [facility] for [facility], [facility]

## 2024-05-12 NOTE — Progress Notes (Signed)
 Pt in office to discuss irregular heavy bleeding and perimenopause symptoms.  Pt has been on pills and IUD in the past., pt has had an IUD expelled with cycle in the past.  Pt has had transfusions in past due to anemia. Pt wears adult diapers at night due to heaviness and having clots.  Pt states she does have hot flashes, night sweats, headaches, heart palpitations- around cycle time.   Pt declines counseling services, GAD score 11.  Last mammo 11/2023. Pt would like pap next year.

## 2024-05-13 ENCOUNTER — Ambulatory Visit: Payer: Self-pay | Admitting: Obstetrics and Gynecology

## 2024-05-13 LAB — FERRITIN: Ferritin: 28 ng/mL (ref 15–150)

## 2024-05-13 LAB — TSH RFX ON ABNORMAL TO FREE T4: TSH: 0.946 u[IU]/mL (ref 0.450–4.500)

## 2024-05-13 LAB — CBC
Hematocrit: 43.7 % (ref 34.0–46.6)
Hemoglobin: 14.3 g/dL (ref 11.1–15.9)
MCH: 31.2 pg (ref 26.6–33.0)
MCHC: 32.7 g/dL (ref 31.5–35.7)
MCV: 95 fL (ref 79–97)
Platelets: 319 x10E3/uL (ref 150–450)
RBC: 4.59 x10E6/uL (ref 3.77–5.28)
RDW: 11.8 % (ref 11.7–15.4)
WBC: 5.7 x10E3/uL (ref 3.4–10.8)

## 2024-05-13 LAB — IRON AND TIBC
Iron Saturation: 24 % (ref 15–55)
Iron: 88 ug/dL (ref 27–159)
Total Iron Binding Capacity: 366 ug/dL (ref 250–450)
UIBC: 278 ug/dL (ref 131–425)

## 2024-06-01 ENCOUNTER — Ambulatory Visit (HOSPITAL_COMMUNITY)
Admission: RE | Admit: 2024-06-01 | Discharge: 2024-06-01 | Disposition: A | Discharge status: Home / Self Care | Source: Ambulatory Visit | Attending: Obstetrics and Gynecology | Admitting: Obstetrics and Gynecology

## 2024-06-01 DIAGNOSIS — N939 Abnormal uterine and vaginal bleeding, unspecified: Secondary | ICD-10-CM | POA: Diagnosis not present

## 2024-06-11 ENCOUNTER — Encounter: Payer: Self-pay | Admitting: Family Medicine

## 2024-06-11 ENCOUNTER — Ambulatory Visit: Admitting: Obstetrics and Gynecology

## 2024-06-11 VITALS — BP 137/89 | HR 96 | Wt 145.0 lb

## 2024-06-11 DIAGNOSIS — N939 Abnormal uterine and vaginal bleeding, unspecified: Secondary | ICD-10-CM

## 2024-06-11 NOTE — Progress Notes (Signed)
   ESTABLISHED GYNECOLOGY VISIT Chief Complaint  Patient presents with   Follow-up    Subjective:  Emily Small is a 43 y.o. [phone] presenting for labs and ultrasound review  She is still considering options for management Declines EMB today but will return for this.   Lab Results  Component Value Date   WBC 5.7 05/12/2024   HGB 14.3 05/12/2024   HCT 43.7 05/12/2024   MCV 95 05/12/2024   PLT 319 05/12/2024   Lab Results  Component Value Date   IRON  88 05/12/2024   TIBC 366 05/12/2024   FERRITIN 28 05/12/2024   Lab Results  Component Value Date   TSH 0.946 05/12/2024     Pelvic ultrasound FINDINGS: Uterus   Measurements: 10.7 x 5.8 x 6.7 cm = volume: 221 mL. No fibroids or other mass visualized.   Endometrium   Thickness: 19.2 mm. Endometrial thickening primarily at the uterine fundus.   Right ovary   Measurements: 3 x 2.6 x 2.1 cm = volume: 8.7 mL. Normal appearance/no adnexal mass.   Left ovary   Measurements: 3.2 x 2 x 2.2 cm = volume: 7.3 mL. Normal appearance/no adnexal mass.   Other findings   No abnormal free fluid.   IMPRESSION: Endometrial thickness of 19.2 mm. If bleeding remains unresponsive to hormonal or medical therapy, focal lesion work-up with sonohysterogram should be considered. Endometrial biopsy should also be considered in pre-menopausal patients at high risk for endometrial carcinoma. (Ref: Radiological Reasoning: Algorithmic Workup of Abnormal Vaginal Bleeding with Endovaginal Sonography and Sonohysterography. AJR 2008; [id number]:D31-26) Review of Systems:   Pertinent items are noted in HPI  Pertinent History Reviewed:  Reviewed past medical,surgical, social and family history.  Reviewed problem list, medications and allergies.  Objective:   Vitals:   06/11/24 1036  BP: 137/89  Pulse: 96  Weight: 145 lb (65.8 kg)   Physical Examination:   General appearance - well appearing, and in no distress  Mental status -  alert, oriented to person, place, and time  Psych:  normal mood and affect    Assessment and Plan:  1. Abnormal uterine bleeding (Primary) Reviewed labs and ultrasound in detail. Recommend EMB. Reviewed options for pain control including ibuprofen  prior to the procedure and paracervical block. Again reviewed options for management and patient has chosen IUD. Can do at same time as EMB.   Return for EMB & IUD.  Future Appointments  Date Time Provider Department Center  06/22/2024  1:10 PM Chandra Toribio POUR, MD [facility]  07/14/2024  1:30 PM Constant, Winton, MD [facility] None    Rollo ONEIDA Bring, MD, FACOG Obstetrician & Gynecologist, [facility] for [facility], [facility]

## 2024-06-11 NOTE — Progress Notes (Signed)
 Pt states she does not want to do EMB today, she is not prepared.

## 2024-06-22 ENCOUNTER — Ambulatory Visit: Admitting: Family Medicine

## 2024-06-22 ENCOUNTER — Encounter: Payer: Self-pay | Admitting: Family Medicine

## 2024-06-22 VITALS — BP 122/76 | HR 106 | Ht 62.0 in | Wt 145.4 lb

## 2024-06-22 DIAGNOSIS — H9202 Otalgia, left ear: Secondary | ICD-10-CM | POA: Insufficient documentation

## 2024-06-22 DIAGNOSIS — I10 Essential (primary) hypertension: Secondary | ICD-10-CM

## 2024-06-22 DIAGNOSIS — N920 Excessive and frequent menstruation with regular cycle: Secondary | ICD-10-CM

## 2024-06-22 DIAGNOSIS — D500 Iron deficiency anemia secondary to blood loss (chronic): Secondary | ICD-10-CM

## 2024-06-22 NOTE — Assessment & Plan Note (Signed)
 Follow-up on labs drawn at GYN office. Ferritin has improved from 13 to 28. - Continue ferrous sulfate  325 mg daily. - Reassured that taking it with her multivitamin is safe.

## 2024-06-22 NOTE — Patient Instructions (Signed)
 It was nice to see you today,  We addressed the following topics today: - I would like you to start using Flonase  nasal spray every day. Use two sprays in each nostril. This is for allergies and is not for as-needed use. - Please let us  know if your ear symptoms get worse, if you notice any more drainage, or if you develop any hearing loss or dizziness. If this happens, I will send a referral to an Ear, Nose, and Throat specialist for you. - Your next physical exam with labs will be in about six months, around May of next year. You can schedule your lab appointment for a week before your visit if you want to discuss the results during the appointment.  Have a great day,  Rolan Slain, MD

## 2024-06-22 NOTE — Assessment & Plan Note (Signed)
 Patient has known heavy menstrual bleeding and is being managed by gynecology. Ultrasound showed a thickened endometrium. - Awaiting endometrial biopsy, IUD insertion, and Pap smear with GYN.

## 2024-06-22 NOTE — Assessment & Plan Note (Signed)
 Well-controlled on current medication. - Continue amlodipine . - Continue home BP monitoring.

## 2024-06-22 NOTE — Progress Notes (Signed)
 Established Patient Office Visit  Subjective   Patient ID: Emily Small, female    DOB: 1981/02/18  Age: 43 y.o. MRN: [id number]  Chief Complaint  Patient presents with   Medical Management of Chronic Issues    HPI  Subjective - Left ear issue: Reports issue started in July/August after [place]. Woke up in the middle of the night with severe ear pain, crackling, popping, and bleeding. Assumed a ruptured eardrum. Recently, noted pinkish drainage after cleaning the ear. Reports intermittent, sharp pain every couple of weeks. No hearing loss or vertigo. Notes pressure-like sensation when changing elevation. History of allergies, with recent increase in sneezing.  - Heavy Menstrual Bleeding: Following up on HMB. Awaiting IUD placement. Had an ultrasound which showed a thickened uterine lining. Awaiting endometrial biopsy, IUD insertion, and Pap smear to be done at the same time. Not postmenopausal, no history of postmenopausal bleeding.  - Hypertension: Follow-up. Reports good BP readings at home.  - Iron -deficiency Anemia: Taking iron  daily. Ferritin increased from 13 to 28. Occasionally takes Vitamin C with it. Also takes a multivitamin with iron . No side effects like cramping or stomach discomfort.  Medications Amlodipine  for hypertension, ferrous sulfate  325 mg (65 mg elemental) for anemia. Takes an Al[employer]! brand multivitamin containing iron .  PMH, PSH, FH, Social Hx PMHx: Hypertension, iron -deficiency anemia, history of eardrum rupture in the same ear. PSH: None mentioned. FH: None mentioned. Social Hx: [place] [employer] in [place]. Recent travel to [place] .  ROS HEENT: Positive for left ear pain, intermittent bleeding/pinkish drainage, popping/crackling sensation. No hearing loss. No vertigo. Positive for sneezing. CV: Negative for chest pain, palpitations. GI: Negative for abdominal discomfort.   The 10-year ASCVD risk score (Arnett DK,  et al., 2019) is: 0.8%  Health Maintenance Due  Topic Date Due   Hepatitis B Vaccines 19-59 Average Risk (1 of 3 - 19+ 3-dose series) Never done   HPV VACCINES (1 - 3-dose SCDM series) Never done   COVID-19 Vaccine (1 - 2025-26 season) Never done      Objective:     BP 122/76   Pulse (!) 106   Ht 5' 2 (1.575 m)   Wt 145 lb 6.4 oz (66 kg)   LMP 06/12/2024   SpO2 99%   BMI 26.59 kg/m    Physical Exam Gen: alert, oriented HEENT: Left ear canal is mildly erythematous. Tympanic membrane appears intact with no signs of perforation. Pulm: no respiratory distress Psych: pleasant affect     No results found for any visits on 06/22/24.      Assessment & Plan:   Essential hypertension Assessment & Plan: Well-controlled on current medication. - Continue amlodipine . - Continue home BP monitoring.   Iron  deficiency anemia due to chronic blood loss Assessment & Plan: Follow-up on labs drawn at GYN office. Ferritin has improved from 13 to 28. - Continue ferrous sulfate  325 mg daily. - Reassured that taking it with her multivitamin is safe.   Menorrhagia with regular cycle Assessment & Plan: Patient has known heavy menstrual bleeding and is being managed by gynecology. Ultrasound showed a thickened endometrium. - Awaiting endometrial biopsy, IUD insertion, and Pap smear with GYN.   Left ear pain Assessment & Plan: Reports a history consistent with a possible traumatic tympanic membrane rupture in July/August with subsequent bleeding and pain. Now presents with intermittent sharp pain and occasional pinkish drainage. Denies hearing loss or vertigo. Examination today shows a mildly erythematous  external ear canal but an intact TM. The current symptoms may be related to eustachian tube dysfunction secondary to allergies or otitis externa. - Start Flonase , two sprays in each nostril daily for allergies. - Monitor symptoms. - If symptoms worsen, or if there is any new  discharge, hearing loss, or vertigo, to message for a referral to ENT.      Return in about 6 months (around 12/20/2024) for physical.    Toribio MARLA Slain, MD

## 2024-06-22 NOTE — Assessment & Plan Note (Signed)
 Reports a history consistent with a possible traumatic tympanic membrane rupture in July/August with subsequent bleeding and pain. Now presents with intermittent sharp pain and occasional pinkish drainage. Denies hearing loss or vertigo. Examination today shows a mildly erythematous external ear canal but an intact TM. The current symptoms may be related to eustachian tube dysfunction secondary to allergies or otitis externa. - Start Flonase , two sprays in each nostril daily for allergies. - Monitor symptoms. - If symptoms worsen, or if there is any new discharge, hearing loss, or vertigo, to message for a referral to ENT.

## 2024-07-14 ENCOUNTER — Other Ambulatory Visit (HOSPITAL_COMMUNITY)
Admission: RE | Admit: 2024-07-14 | Discharge: 2024-07-14 | Disposition: A | Source: Ambulatory Visit | Attending: Obstetrics and Gynecology | Admitting: Obstetrics and Gynecology

## 2024-07-14 ENCOUNTER — Encounter: Payer: Self-pay | Admitting: Obstetrics and Gynecology

## 2024-07-14 ENCOUNTER — Ambulatory Visit: Admitting: Obstetrics and Gynecology

## 2024-07-14 VITALS — BP 151/93 | HR 116 | Ht 62.0 in | Wt 146.8 lb

## 2024-07-14 DIAGNOSIS — N939 Abnormal uterine and vaginal bleeding, unspecified: Secondary | ICD-10-CM

## 2024-07-14 DIAGNOSIS — Z3202 Encounter for pregnancy test, result negative: Secondary | ICD-10-CM | POA: Diagnosis not present

## 2024-07-14 DIAGNOSIS — Z30430 Encounter for insertion of intrauterine contraceptive device: Secondary | ICD-10-CM

## 2024-07-14 LAB — POCT URINE PREGNANCY: Preg Test, Ur: NEGATIVE

## 2024-07-14 MED ORDER — LEVONORGESTREL 20 MCG/DAY IU IUD
1.0000 | INTRAUTERINE_SYSTEM | Freq: Once | INTRAUTERINE | Status: AC
  Administered 2024-07-14: 1 via INTRAUTERINE

## 2024-07-14 NOTE — Progress Notes (Signed)
      GYNECOLOGY OFFICE PROCEDURE NOTE   Emily Small is a 43 y.o. [id number] here for endometrial biopsy and Mirena  IUD insertion for abnormal uterine bleeding.   ENDOMETRIAL BIOPSY     The indications for endometrial biopsy were reviewed.   Risks of the biopsy including cramping, bleeding, infection, uterine perforation. The patient states she understands the R/B/I/A and agrees to undergo procedure today. Urine pregnancy test was Negative. Consent was signed. Time out was performed.    Patient was positioned in dorsal lithotomy position. A vaginal speculum was placed.  The cervix was visualized and was prepped with Betadine. Benzocaine  spray applied. A single-toothed tenaculum was placed on the anterior lip of the cervix to stabilize it. The 3 mm pipelle was easily introduced into the endometrial cavity without difficulty to a depth of 7 cm, and a Moderate amount of tissue was obtained after two passes and sent to pathology. The patient tolerated the procedure well.     IUD Insertion Procedure Note Procedure: IUD insertion with Mirena  UPT: Negative   Patient identified.  Risks, benefits and alternatives of procedure were discussed including irregular bleeding, cramping, infection, malpositioning or misplacement of the IUD outside the uterus. Also discussed >99% contraception efficacy, increased risk of ectopic pregnancy with failure of method.  Consent signed. Time out performed.   Speculum in vagina and cervix visualized, prepped with betadine.    Grasped with a single tooth tenaculum , Uterus sounded to 7 cm , and IUD then inserted without difficulty per manufacturer's instructions and strings cut to 3 cm below cervical os and all instruments removed. Pt tolerated well with minimal pain and bleeding.   Uncomplicated IUD insertion Ibuprofen  prn cramping Manufacturer pamphlet/patient information given.  Reviewed timing of efficacy for contraception and to use an alternative form of birth  control until that time.  Rollo ONEIDA Bring, MD, FACOG Obstetrician & Gynecologist, [facility] for [facility], [facility]

## 2024-07-14 NOTE — Addendum Note (Signed)
 Addended by: Mellonie Guess V on: 07/14/2024 02:56 PM   Modules accepted: Orders

## 2024-07-16 ENCOUNTER — Ambulatory Visit: Payer: Self-pay | Admitting: Obstetrics and Gynecology

## 2024-07-16 LAB — SURGICAL PATHOLOGY

## 2024-08-19 ENCOUNTER — Other Ambulatory Visit: Payer: Self-pay | Admitting: Family Medicine

## 2024-08-19 DIAGNOSIS — F411 Generalized anxiety disorder: Secondary | ICD-10-CM

## 2024-08-19 DIAGNOSIS — I10 Essential (primary) hypertension: Secondary | ICD-10-CM

## 2024-08-20 ENCOUNTER — Other Ambulatory Visit: Payer: Self-pay | Admitting: Obstetrics and Gynecology

## 2024-08-20 ENCOUNTER — Ambulatory Visit: Admitting: Obstetrics and Gynecology

## 2024-08-20 ENCOUNTER — Encounter: Payer: Self-pay | Admitting: Obstetrics and Gynecology

## 2024-08-20 VITALS — BP 141/89 | HR 105 | Ht 62.0 in | Wt 150.0 lb

## 2024-08-20 DIAGNOSIS — N644 Mastodynia: Secondary | ICD-10-CM | POA: Diagnosis not present

## 2024-08-20 DIAGNOSIS — Z30431 Encounter for routine checking of intrauterine contraceptive device: Secondary | ICD-10-CM | POA: Diagnosis not present

## 2024-08-20 NOTE — Progress Notes (Signed)
 44 y.o. GYN presents for IUD string check

## 2024-08-20 NOTE — Progress Notes (Signed)
" ° °  ESTABLISHED GYNECOLOGY VISIT Chief Complaint  Patient presents with   Contraception    IUD STRING CHECK    Subjective:  Emily Small is a 44 y.o. [phone] presenting for IUD check  Mirena  IUD inserted 07/14/24 for management of abnormal uterine bleeding. She also had an EMB at that time that was benign.  She reports that she had spotting that has stopped. Had a light period. Happy with IUD. Reports breast tenderness bilaterally, worse near nipples  Review of Systems:   Pertinent items are noted in HPI  Pertinent History Reviewed:  Reviewed past medical,surgical, social and family history.  Reviewed problem list, medications and allergies.  Objective:   Vitals:   08/20/24 1101  BP: (!) 147/85  Pulse: 98  Weight: 150 lb (68 kg)  Height: 5' 2 (1.575 m)   Physical Examination:   General appearance - well appearing, and in no distress  Mental status - alert, oriented to person, place, and time  Psych:  normal mood and affect  Skin - warm and dry, normal color, no suspicious lesions noted  Breasts- bilateral breasts without masses, normal nipples  Pelvic -  VULVA: normal appearing vulva with no masses, tenderness or lesions   VAGINA: normal appearing vagina with normal color and discharge, no lesions   CERVIX: normal appearing cervix without discharge or lesions, IUD strings normal length  Extremities:  No swelling or varicosities noted  Chaperone present for exam  Assessment and Plan:  1. IUD check up (Primary) Anticipatory guidance F/u prn  2. Breast tenderness Will do diag mammo as a precaution - MM 3D DIAGNOSTIC MAMMOGRAM BILATERAL BREAST; Future   Future Appointments  Date Time Provider Department Center  12/14/2024  9:00 AM [facility] [place] [place] [facility]  12/21/2024  1:10 PM Chandra Toribio POUR, MD [facility]    Rollo ONEIDA Bring, MD, FACOG Obstetrician & Gynecologist, [facility] for [facility], [facility]  "

## 2024-08-26 ENCOUNTER — Inpatient Hospital Stay: Admission: RE | Admit: 2024-08-26 | Discharge: 2024-08-26 | Attending: Obstetrics and Gynecology

## 2024-08-26 DIAGNOSIS — N644 Mastodynia: Secondary | ICD-10-CM

## 2024-12-14 ENCOUNTER — Other Ambulatory Visit

## 2024-12-21 ENCOUNTER — Encounter: Admitting: Family Medicine
